# Patient Record
Sex: Female | Born: 1995 | Race: White | Hispanic: No | Marital: Single | Smoking: Never smoker
Health system: Southern US, Community
[De-identification: ages and names within clinical notes are randomized; demographics above are authoritative.]

## PROBLEM LIST (undated history)

## (undated) DIAGNOSIS — N189 Chronic kidney disease, unspecified: Secondary | ICD-10-CM

## (undated) DIAGNOSIS — F909 Attention-deficit hyperactivity disorder, unspecified type: Secondary | ICD-10-CM

## (undated) DIAGNOSIS — R519 Headache, unspecified: Secondary | ICD-10-CM

## (undated) DIAGNOSIS — F419 Anxiety disorder, unspecified: Secondary | ICD-10-CM

## (undated) DIAGNOSIS — F32A Depression, unspecified: Secondary | ICD-10-CM

## (undated) HISTORY — PX: NO PAST SURGERIES: SHX2092

---
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
## 2015-04-08 DIAGNOSIS — F909 Attention-deficit hyperactivity disorder, unspecified type: Secondary | ICD-10-CM | POA: Insufficient documentation

## 2016-10-19 DIAGNOSIS — K5900 Constipation, unspecified: Secondary | ICD-10-CM | POA: Insufficient documentation

## 2016-10-19 DIAGNOSIS — F411 Generalized anxiety disorder: Secondary | ICD-10-CM | POA: Insufficient documentation

## 2017-08-19 DIAGNOSIS — R002 Palpitations: Secondary | ICD-10-CM | POA: Insufficient documentation

## 2017-11-06 ENCOUNTER — Emergency Department (HOSPITAL_COMMUNITY)
Admission: EM | Admit: 2017-11-06 | Discharge: 2017-11-06 | Disposition: A | Discharge status: Home / Self Care | Payer: Managed Care, Other (non HMO) | Attending: Physician Assistant | Admitting: Physician Assistant

## 2017-11-06 ENCOUNTER — Emergency Department (HOSPITAL_COMMUNITY): Payer: Managed Care, Other (non HMO)

## 2017-11-06 ENCOUNTER — Encounter (HOSPITAL_COMMUNITY): Payer: Self-pay | Admitting: Emergency Medicine

## 2017-11-06 DIAGNOSIS — K5900 Constipation, unspecified: Secondary | ICD-10-CM | POA: Diagnosis not present

## 2017-11-06 DIAGNOSIS — R110 Nausea: Secondary | ICD-10-CM | POA: Diagnosis not present

## 2017-11-06 DIAGNOSIS — R1084 Generalized abdominal pain: Secondary | ICD-10-CM | POA: Diagnosis present

## 2017-11-06 LAB — CBC WITH DIFFERENTIAL/PLATELET
BASOS ABS: 0 10*3/uL (ref 0.0–0.1)
Basophils Relative: 0 %
EOS ABS: 0 10*3/uL (ref 0.0–0.7)
EOS PCT: 0 %
HCT: 37.2 % (ref 36.0–46.0)
Hemoglobin: 12.6 g/dL (ref 12.0–15.0)
Lymphocytes Relative: 9 %
Lymphs Abs: 0.7 10*3/uL (ref 0.7–4.0)
MCH: 32.1 pg (ref 26.0–34.0)
MCHC: 33.9 g/dL (ref 30.0–36.0)
MCV: 94.9 fL (ref 78.0–100.0)
Monocytes Absolute: 0.7 10*3/uL (ref 0.1–1.0)
Monocytes Relative: 8 %
Neutro Abs: 6.5 10*3/uL (ref 1.7–7.7)
Neutrophils Relative %: 83 %
PLATELETS: 173 10*3/uL (ref 150–400)
RBC: 3.92 MIL/uL (ref 3.87–5.11)
RDW: 12.8 % (ref 11.5–15.5)
WBC: 7.9 10*3/uL (ref 4.0–10.5)

## 2017-11-06 LAB — URINALYSIS, ROUTINE W REFLEX MICROSCOPIC
Bilirubin Urine: NEGATIVE
GLUCOSE, UA: NEGATIVE mg/dL
KETONES UR: 80 mg/dL — AB
Leukocytes, UA: NEGATIVE
Nitrite: NEGATIVE
PROTEIN: NEGATIVE mg/dL
Specific Gravity, Urine: 1.027 (ref 1.005–1.030)
pH: 5 (ref 5.0–8.0)

## 2017-11-06 LAB — COMPREHENSIVE METABOLIC PANEL
ALT: 10 U/L — ABNORMAL LOW (ref 14–54)
ANION GAP: 9 (ref 5–15)
AST: 14 U/L — AB (ref 15–41)
Albumin: 3.9 g/dL (ref 3.5–5.0)
Alkaline Phosphatase: 32 U/L — ABNORMAL LOW (ref 38–126)
BUN: 10 mg/dL (ref 6–20)
CHLORIDE: 104 mmol/L (ref 101–111)
CO2: 23 mmol/L (ref 22–32)
Calcium: 8.5 mg/dL — ABNORMAL LOW (ref 8.9–10.3)
Creatinine, Ser: 0.63 mg/dL (ref 0.44–1.00)
Glucose, Bld: 93 mg/dL (ref 65–99)
POTASSIUM: 3.4 mmol/L — AB (ref 3.5–5.1)
Sodium: 136 mmol/L (ref 135–145)
Total Bilirubin: 1.1 mg/dL (ref 0.3–1.2)
Total Protein: 7 g/dL (ref 6.5–8.1)

## 2017-11-06 LAB — LIPASE, BLOOD: LIPASE: 28 U/L (ref 11–51)

## 2017-11-06 LAB — PREGNANCY, URINE: PREG TEST UR: NEGATIVE

## 2017-11-06 LAB — I-STAT BETA HCG BLOOD, ED (MC, WL, AP ONLY): HCG, QUANTITATIVE: 10 m[IU]/mL — AB (ref <5)

## 2017-11-06 LAB — I-STAT CG4 LACTIC ACID, ED: LACTIC ACID, VENOUS: 0.68 mmol/L (ref 0.5–1.9)

## 2017-11-06 MED ORDER — SODIUM CHLORIDE 0.9 % IV BOLUS
1000.0000 mL | Freq: Once | INTRAVENOUS | Status: AC
  Administered 2017-11-06: 1000 mL via INTRAVENOUS

## 2017-11-06 MED ORDER — KETOROLAC TROMETHAMINE 30 MG/ML IJ SOLN
30.0000 mg | Freq: Once | INTRAMUSCULAR | Status: AC
  Administered 2017-11-06: 30 mg via INTRAVENOUS
  Filled 2017-11-06: qty 1

## 2017-11-06 MED ORDER — ONDANSETRON HCL 4 MG/2ML IJ SOLN
4.0000 mg | Freq: Once | INTRAMUSCULAR | Status: AC
  Administered 2017-11-06: 4 mg via INTRAVENOUS
  Filled 2017-11-06: qty 2

## 2017-11-06 MED ORDER — ACETAMINOPHEN 500 MG PO TABS
1000.0000 mg | ORAL_TABLET | Freq: Once | ORAL | Status: AC
  Administered 2017-11-06: 1000 mg via ORAL
  Filled 2017-11-06: qty 2

## 2017-11-06 MED ORDER — IBUPROFEN 600 MG PO TABS: 600.0000 mg | ORAL_TABLET | Freq: Four times a day (QID) | ORAL | 0 refills | Status: DC | PRN

## 2017-11-06 MED ORDER — ONDANSETRON HCL 4 MG PO TABS: 4.0000 mg | ORAL_TABLET | Freq: Four times a day (QID) | ORAL | 0 refills | Status: DC

## 2017-11-06 NOTE — ED Provider Notes (Signed)
[facility]-EMERGENCY DEPT Provider Note   CSN: [id number] Arrival date & time: 11/06/17  1317     History   Chief Complaint Chief Complaint  Patient presents with  . Flank Pain    HPI Gail Wilson is a 22 y.o. female who presents to the emergency department with a chief complaint of flank pain.    The patient endorses nausea and periumbilical abdominal pain that began yesterday.  She reports that today the pain is spread to her bilateral flanks.  She characterizes the pain as dull and throbbing.  It is worse with standing and improved with laying down.  She denies vomiting, fever, chills, diarrhea, dysuria, hematuria, vaginal pain or itching, melena, hematochezia, or back pain.  She is concerned that she may have a UTI or a kidney infection as she has had several in the past.  She is passing flatus.  No increased burping or belching.  Last bowel movement was 5 days ago.  She reports that she has had difficulty with constipation in the past.  She states that she uses Senokot laxatives frequently.  She states that she was only able to have a bowel movement 5 days ago after taking a laxative.  She has an Implanon for birth control.    Reports that she drinks 6-7 alcoholic beverages 2 nights ago, but reports that this is how much she will typically drink in one setting and is not a change from her usual use.  She denies a history of chronic over-the-counter pain medications.  No history of abdominal surgery.  She is sexually active with one female partner.  No history of STDs.  No history of nephrolithiasis.  The history is provided by the patient. No language interpreter was used.    Past Medical History:  Diagnosis Date  . ADHD (attention deficit hyperactivity disorder)     Patient Active Problem List   Diagnosis Date Noted  . Encounter for fitting of diaphragm 05/03/2014    History reviewed. No pertinent surgical history.   OB History   None       Home Medications    Prior to Admission medications   Medication Sig Start Date End Date Taking? Authorizing Provider  acetaminophen (TYLENOL) 500 MG tablet Take 500 mg by mouth every 6 (six) hours as needed for headache.   Yes [provider]  etonogestrel (NEXPLANON) 68 MG IMPL implant 1 each by Subdermal route once.   Yes [provider]  ibuprofen (ADVIL,MOTRIN) 600 MG tablet Take 1 tablet (600 mg total) by mouth every 6 (six) hours as needed. 11/06/17   Rashad Auld A, PA-C  nitrofurantoin, macrocrystal-monohydrate, (MACROBID) 100 MG capsule Take 100 mg by mouth as directed. Before sexual intercourse 10/27/17   [provider]  ondansetron (ZOFRAN) 4 MG tablet Take 1 tablet (4 mg total) by mouth every 6 (six) hours. 11/06/17   Yahel Fuston, Coral Else, PA-C    Family History History reviewed. No pertinent family history.  Social History Social History   Tobacco Use  . Smoking status: Never Smoker  . Smokeless tobacco: Never Used  Substance Use Topics  . Alcohol use: No    Alcohol/week: 0.0 oz  . Drug use: No     Allergies   Amoxil [amoxicillin] and Penicillins   Review of Systems Review of Systems  Constitutional: Negative for activity change, chills and fever.  HENT: Negative for congestion.   Eyes: Negative for visual disturbance.  Respiratory: Negative for shortness of breath.  Cardiovascular: Negative for chest pain.  Gastrointestinal: Positive for abdominal pain, constipation and nausea. Negative for abdominal distention, anal bleeding, blood in stool, diarrhea, rectal pain and vomiting.  Genitourinary: Positive for flank pain and vaginal bleeding. Negative for dysuria, frequency, pelvic pain, vaginal discharge and vaginal pain.  Musculoskeletal: Negative for arthralgias, back pain, myalgias and neck stiffness.  Skin: Negative for rash.  Allergic/Immunologic: Negative for immunocompromised state.  Neurological: Negative for dizziness,  syncope, weakness, numbness and headaches.  Psychiatric/Behavioral: Negative for confusion.   Physical Exam Updated Vital Signs BP 98/62 (BP Location: Left Arm)   Pulse 88   Temp 98.7 F (37.1 C) (Oral)   Resp 16   LMP 11/05/2017   SpO2 98%   Physical Exam  Constitutional: No distress.  HENT:  Head: Normocephalic.  Eyes: Conjunctivae are normal.  Neck: Neck supple.  Cardiovascular: Normal rate, regular rhythm, normal heart sounds and intact distal pulses. Exam reveals no gallop and no friction rub.  No murmur heard. Pulmonary/Chest: Effort normal and breath sounds normal. No stridor. No respiratory distress. She has no wheezes. She has no rales. She exhibits no tenderness.  Abdominal: Soft. Bowel sounds are normal. She exhibits no distension and no mass. There is tenderness. There is no rebound and no guarding. No hernia.  Neurological: She is alert.  Skin: Skin is warm. No rash noted.  Psychiatric: Her behavior is normal.  Nursing note and vitals reviewed.   ED Treatments / Results  Labs (all labs ordered are listed, but only abnormal results are displayed) Labs Reviewed  URINALYSIS, ROUTINE W REFLEX MICROSCOPIC - Abnormal; Notable for the following components:      Result Value   APPearance HAZY (*)    Hgb urine dipstick MODERATE (*)    Ketones, ur 80 (*)    Bacteria, UA RARE (*)    All other components within normal limits  COMPREHENSIVE METABOLIC PANEL - Abnormal; Notable for the following components:   Potassium 3.4 (*)    Calcium 8.5 (*)    AST 14 (*)    ALT 10 (*)    Alkaline Phosphatase 32 (*)    All other components within normal limits  I-STAT BETA HCG BLOOD, ED ([facility], [facility], [facility] ONLY) - Abnormal; Notable for the following components:   I-stat hCG, quantitative 10.0 (*)    All other components within normal limits  URINE CULTURE  LIPASE, BLOOD  CBC WITH DIFFERENTIAL/PLATELET  PREGNANCY, URINE  I-STAT CG4 LACTIC ACID, ED    EKG None  Radiology Dg  Abdomen Acute W/chest  Result Date: 11/06/2017 CLINICAL DATA:  Nausea yesterday. Onset of bilateral flank pain today. EXAM: DG ABDOMEN ACUTE W/ 1V CHEST COMPARISON:  PA and lateral chest 07/30/2014. FINDINGS: Single-view of the chest demonstrates clear lungs and normal heart size. No pneumothorax or pleural effusion. Two views of the abdomen show no free intraperitoneal air. The bowel gas pattern is normal. A 0.6 cm in diameter calcification is seen just inferior to the right SI joint. No other unexpected abdominal calcification is identified. IMPRESSION: 0.6 cm calcification just inferior to the right SI joint cannot be definitively characterized. It could be a phlebolith but could also be a ureteral stone. The exam is otherwise negative. Electronically Signed   By: Drusilla Kanner M.D.   On: 11/06/2017 16:43   Ct Renal Stone Study  Result Date: 11/06/2017 CLINICAL DATA:  Bilateral flank pain and nausea since yesterday. EXAM: CT ABDOMEN AND PELVIS WITHOUT CONTRAST TECHNIQUE: Multidetector CT imaging of the abdomen  and pelvis was performed following the standard protocol without IV contrast. COMPARISON:  Chest and two views abdomen today. FINDINGS: Lower chest: Lung bases are clear. No pleural or pericardial effusion. Hepatobiliary: No focal liver abnormality is seen. No gallstones, gallbladder wall thickening, or biliary dilatation. Pancreas: Unremarkable. No pancreatic ductal dilatation or surrounding inflammatory changes. Spleen: Normal in size without focal abnormality. Adrenals/Urinary Tract: Adrenal glands are unremarkable. Kidneys are normal, without renal calculi, focal lesion, or hydronephrosis. Bladder is unremarkable. Calcification in the right side of the pelvis seen on comparison plain films today is most consistent with a phlebolith or a calcified lymph node. Stomach/Bowel: Stomach is within normal limits. The appendix is not definitely visualized but no pericecal inflammatory change is seen. No  evidence of bowel wall thickening, distention, or inflammatory changes. Vascular/Lymphatic: No significant vascular findings are present. No enlarged abdominal or pelvic lymph nodes. Reproductive: Uterus and bilateral adnexa are unremarkable. Tampon is in place. Other: Small volume of free pelvic fluid is consistent with physiologic change. Musculoskeletal: No worrisome lesion. A few small Schmorl's nodes are identified. IMPRESSION: Negative for urinary tract stone. Calcification seen on prior plain films is most compatible with a phlebolith. The appendix is not visualized but no periappendiceal inflammatory process is identified. The exam is otherwise unremarkable. Electronically Signed   By: Drusilla Kanner M.D.   On: 11/06/2017 17:26    Procedures Procedures (including critical care time)  Medications Ordered in ED Medications  sodium chloride 0.9 % bolus 1,000 mL (0 mLs Intravenous Stopped 11/06/17 1759)  ondansetron (ZOFRAN) injection 4 mg (4 mg Intravenous Given 11/06/17 1513)  acetaminophen (TYLENOL) tablet 1,000 mg (1,000 mg Oral Given 11/06/17 1514)  ketorolac (TORADOL) 30 MG/ML injection 30 mg (30 mg Intravenous Given 11/06/17 1800)     Initial Impression / Assessment and Plan / ED Course  I have reviewed the triage vital signs and the nursing notes.  Pertinent labs & imaging results that were available during my care of the patient were reviewed by me and considered in my medical decision making (see chart for details).     22 year old female with no significant past medical history presenting with nausea, abdominal pain, and flank pain. Patient is nontoxic, nonseptic appearing, in no apparent distress.  Patient's pain and other symptoms adequately managed in emergency department.  Fluid bolus given.  Labs, imaging and vitals reviewed.  Patient does not meet the SIRS or Sepsis criteria.  On repeat exam patient does not have a surgical abdomen and there are no peritoneal signs.  No  indication of appendicitis, bowel obstruction, bowel perforation, cholecystitis, diverticulitis, PID or ectopic pregnancy.  Suspect constipation. patient discharged home with symptomatic treatment and given strict instructions for follow-up with GI given chronic constipation.  I have also discussed reasons to return immediately to the ER.  Patient expresses understanding and agrees with plan.  Final Clinical Impressions(s) / ED Diagnoses   Final diagnoses:  Constipation, unspecified constipation type  Nausea    ED Discharge Orders        Ordered    ibuprofen (ADVIL,MOTRIN) 600 MG tablet  Every 6 hours PRN     11/06/17 1801    ondansetron (ZOFRAN) 4 MG tablet  Every 6 hours     11/06/17 1801       Frederik Pear A, PA-C 11/06/17 1809    Mackuen, Cindee Salt, MD 11/09/17 [phone]

## 2017-11-06 NOTE — ED Notes (Signed)
Patient transported to X-ray 

## 2017-11-06 NOTE — ED Notes (Signed)
Pt holding down PO fluids well. Pt drank 8oz of water and has had no emesis.

## 2017-11-06 NOTE — ED Triage Notes (Signed)
Pt c/o bilateral flank pain. Pt states nausea yesterday and then started with flank pain this morning.

## 2017-11-06 NOTE — Discharge Instructions (Addendum)
Thank you for allowing me to care of you today in the emergency department.  Swallow 1 tablet of Zofran every 6 hours as needed for nausea and/or vomiting.  Take 1 tablet of ibuprofen with food every 6 hours as needed for pain.  You can also take 650 mg of Tylenol every 6 hours for pain or alternate between these 2 medications and take 1 dose of each every 3 hours. For constipation:  -Take 2 capfuls of Miralax by mouth mixed with 16 ounces of juice or gatorade once. -After the first day, take 1 capful of Miralax by mouth daily mixed with 16 ounces of juice or gatrorade -If you experience diarrhea, you may decreased the daily dose by 1/2 capful as needed.  You can also mix 4 ounces of prune juice and 4 ounces of grape juice and a glass and warming up in the microwave for 30 seconds to help with constipation.  If you develop new or worsening symptoms including vomiting despite taking Zofran, high fever despite taking Tylenol and ibuprofen, severe abdominal pain despite taking medicine for pain control, dark black or blood in your stool, please return to the emergency department for re-evaluation.

## 2017-11-08 LAB — URINE CULTURE: Culture: NO GROWTH

## 2018-08-10 DIAGNOSIS — N390 Urinary tract infection, site not specified: Secondary | ICD-10-CM | POA: Insufficient documentation

## 2018-10-08 IMAGING — CR DG ABDOMEN ACUTE W/ 1V CHEST
3 series · 3 of 3 positions shown · non-contrast
Comparison: PA and lateral chest 07/30/2014.

CLINICAL DATA: Nausea yesterday. Onset of bilateral flank pain
today.

EXAM:
DG ABDOMEN ACUTE W/ 1V CHEST

[w chest pa]
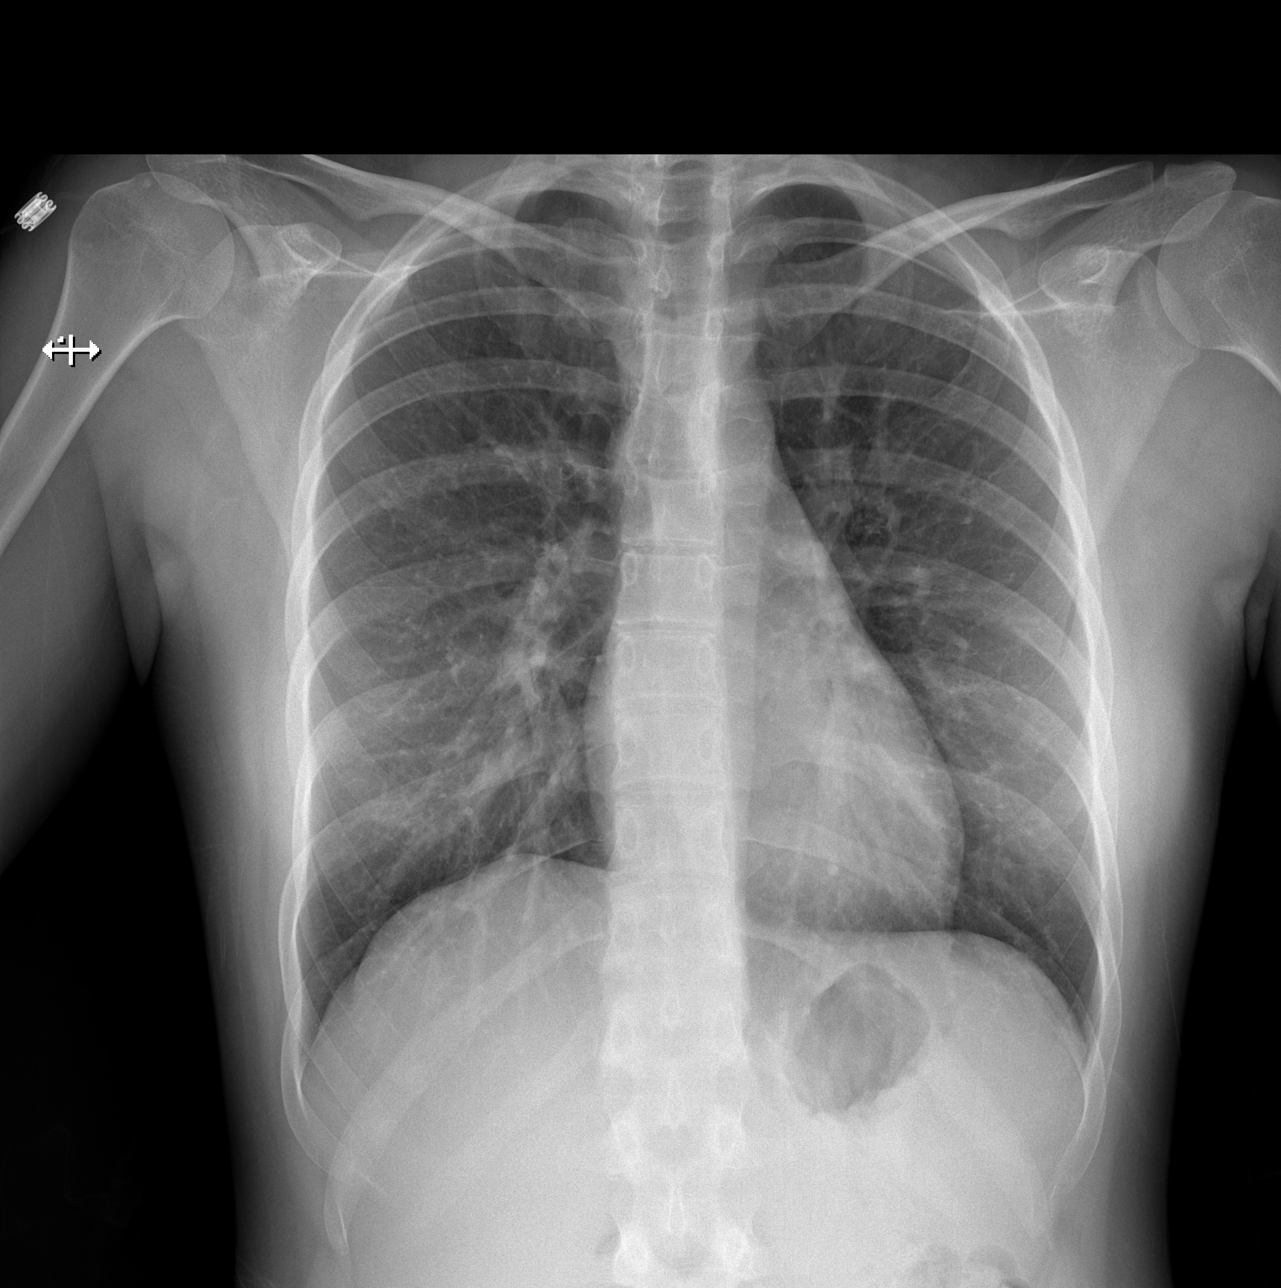

[w abdomen upright]
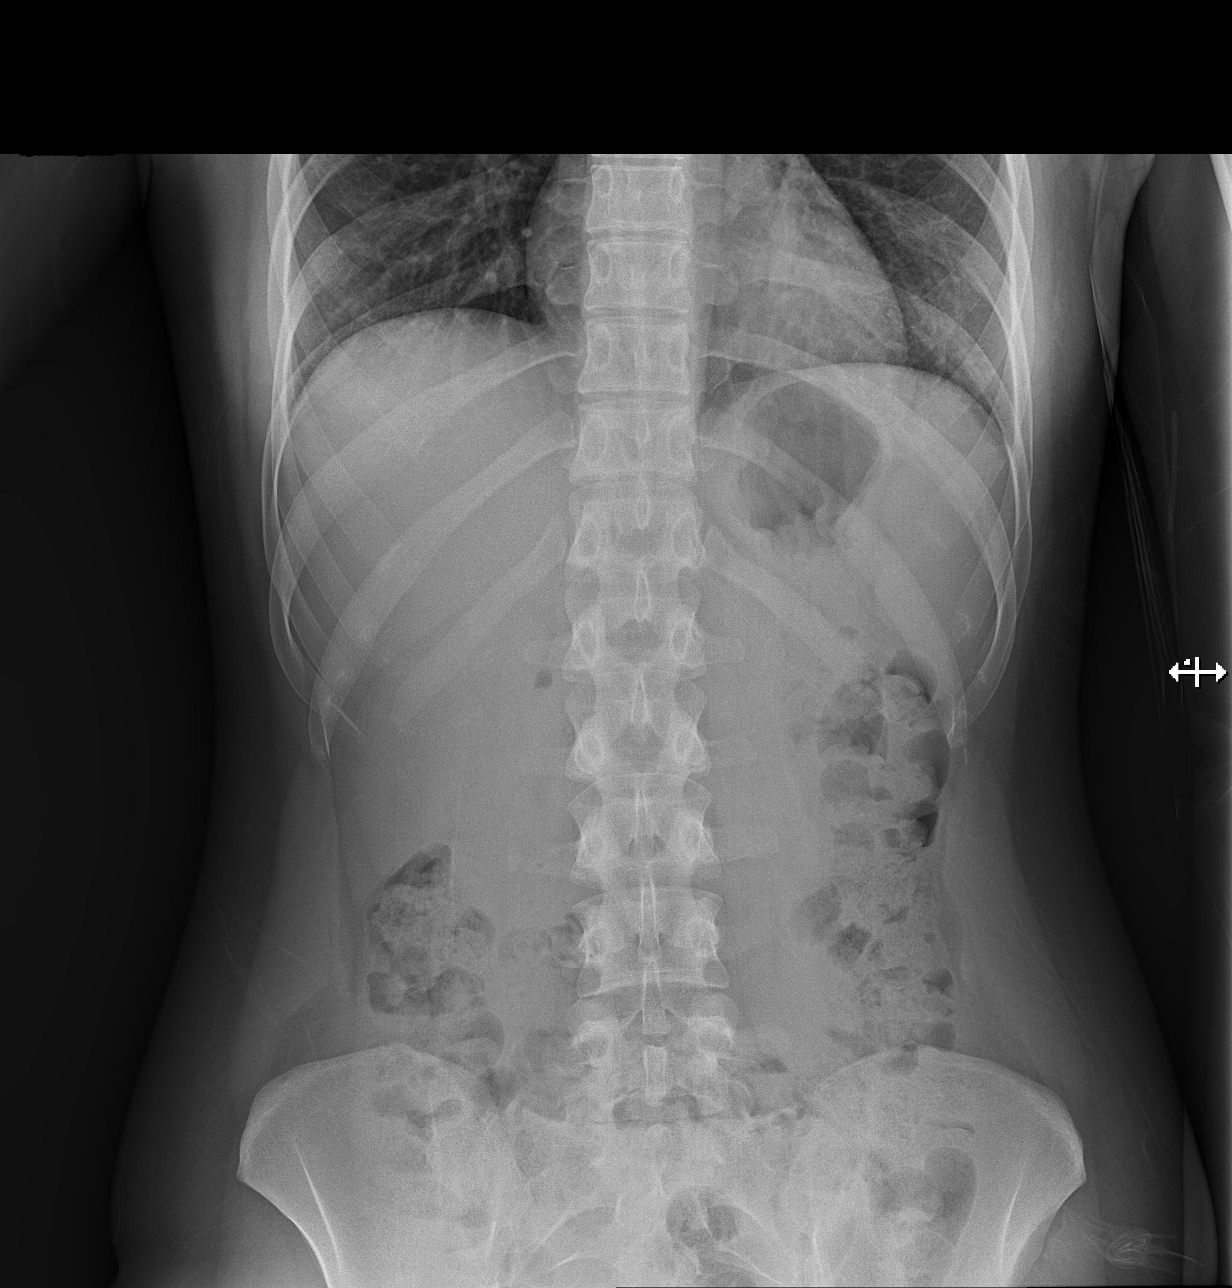

[t abdomen supine]
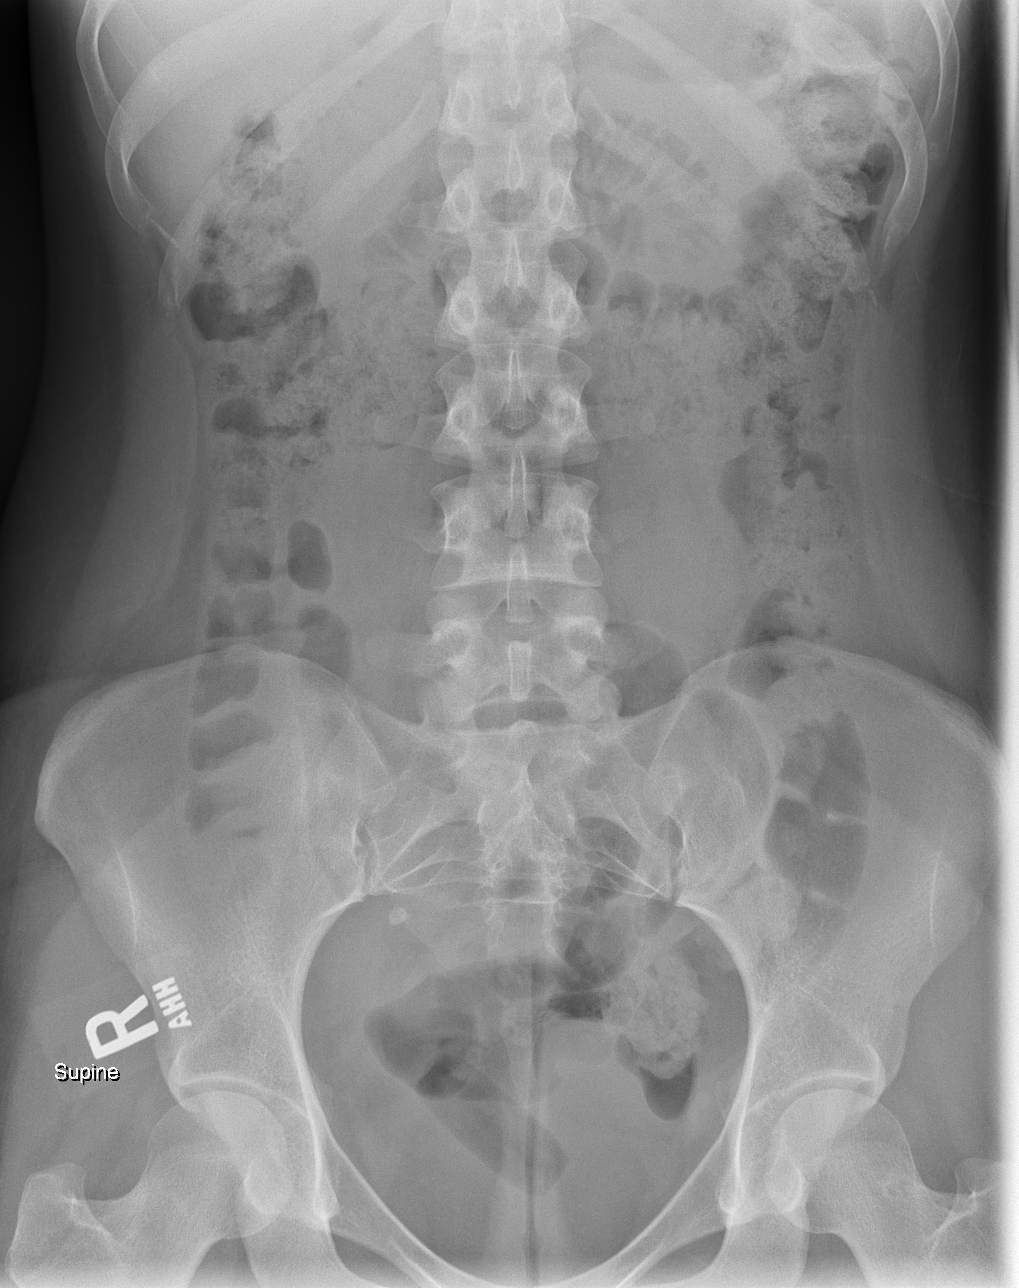

[3 of 3 positions shown; findings below may reference images not displayed]

FINDINGS: Single-view of the chest demonstrates clear lungs and normal heart
size. No pneumothorax or pleural effusion.

Two views of the abdomen show no free intraperitoneal air. The bowel
gas pattern is normal. A 0.6 cm in diameter calcification is seen
just inferior to the right SI joint. No other unexpected abdominal
calcification is identified.
IMPRESSION: 0.6 cm calcification just inferior to the right SI joint cannot be
definitively characterized. It could be a phlebolith but could also
be a ureteral stone. The exam is otherwise negative.

## 2018-10-08 IMAGING — CT CT RENAL STONE PROTOCOL
2 of 4 series · 16 of 46 positions shown, 18 images · non-contrast
Comparison: Chest and two views abdomen today.

CLINICAL DATA: Bilateral flank pain and nausea since yesterday.

EXAM:
CT ABDOMEN AND PELVIS WITHOUT CONTRAST
TECHNIQUE: Multidetector CT imaging of the abdomen and pelvis was performed
following the standard protocol without IV contrast.

[Series 2: axial st · axial · 0.77mm/px · z∈[-511,-111]mm · 13 of 92 slices shown, 15 images]
[im 6/92  soft-tissue]
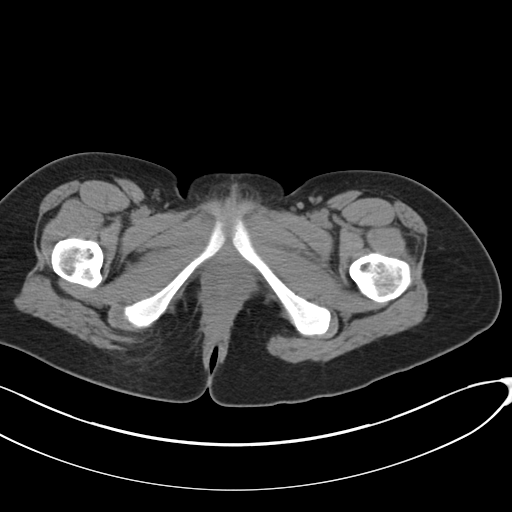
[im 6/92  bone]
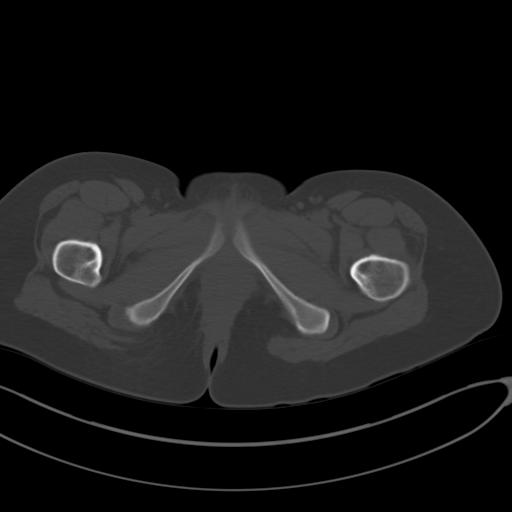
[im 11/92  soft-tissue]
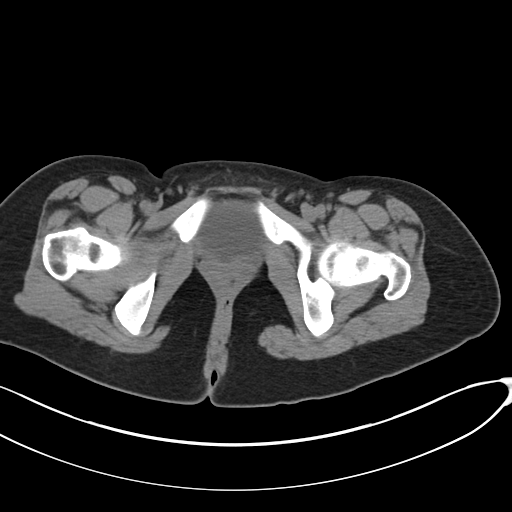
[im 21/92  soft-tissue]
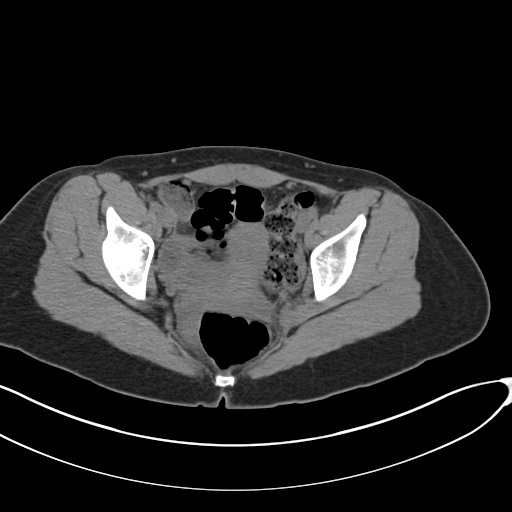
[im 26/92  soft-tissue]
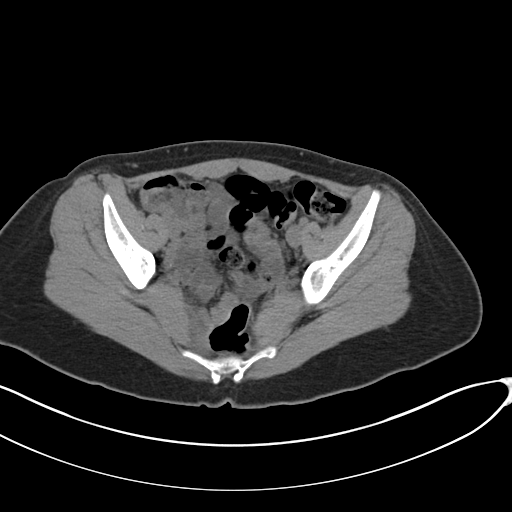
[im 31/92  soft-tissue]
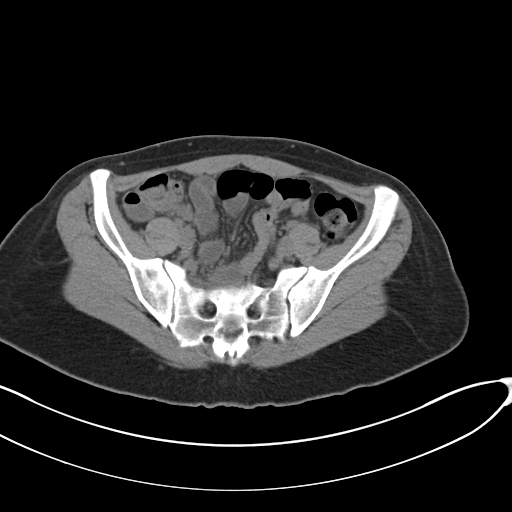
[im 41/92  soft-tissue]
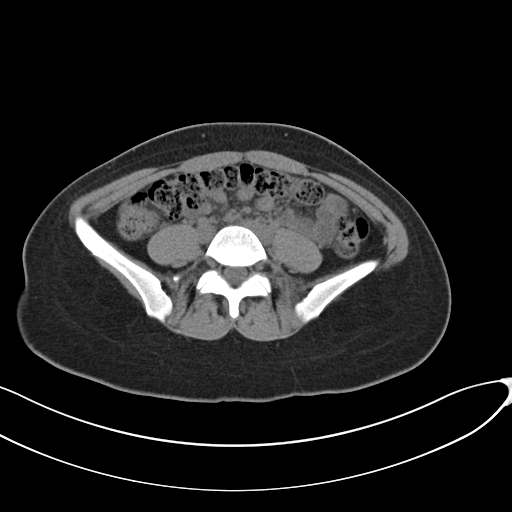
[im 46/92  soft-tissue]
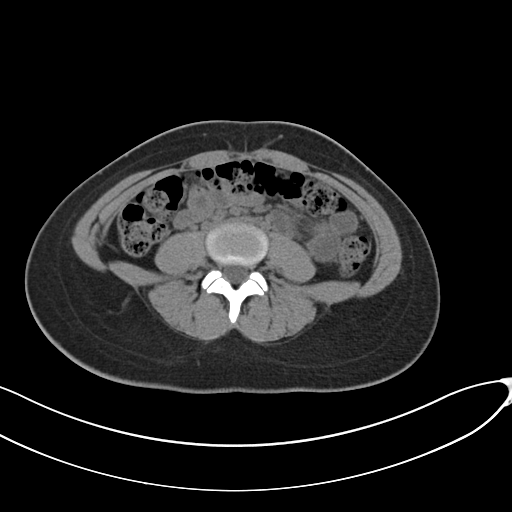
[im 51/92  soft-tissue]
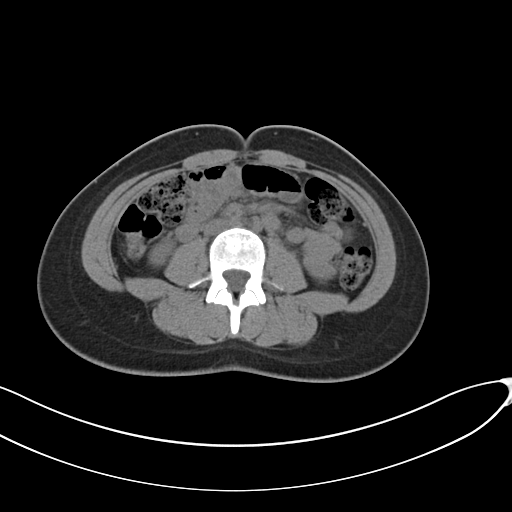
[im 61/92  soft-tissue]
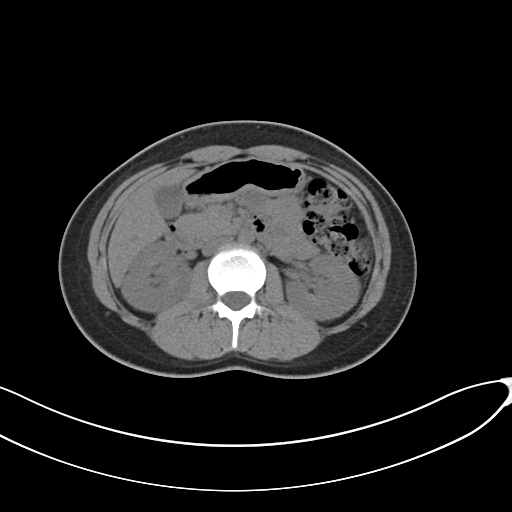
[im 61/92  bone]
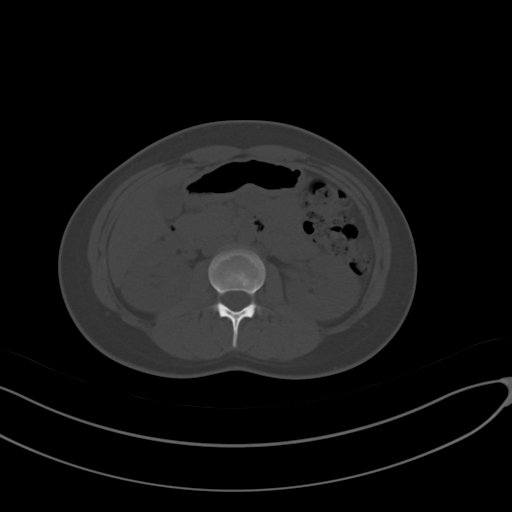
[im 66/92  soft-tissue]
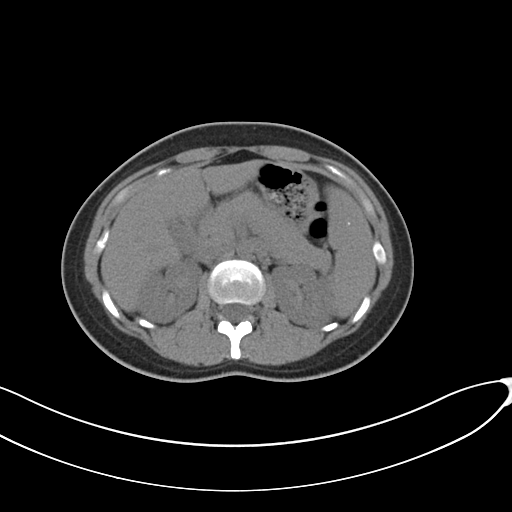
[im 71/92  soft-tissue]
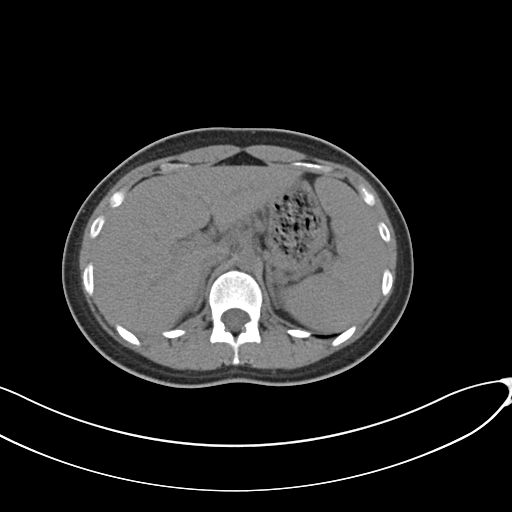
[im 81/92  soft-tissue]
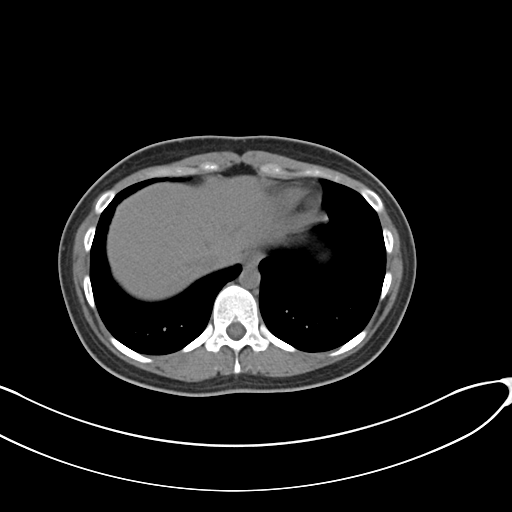
[im 86/92  soft-tissue]
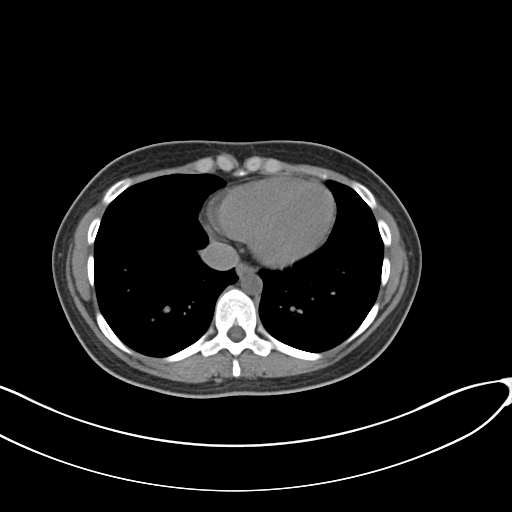

[Series 4: coronal · coronal · 0.68mm/px · 3 of 151 slices shown]
[im 51/151  soft-tissue]
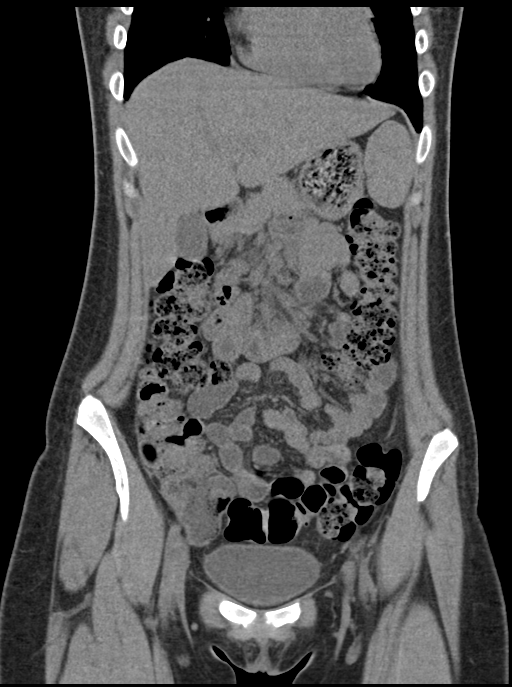
[im 67/151  soft-tissue]
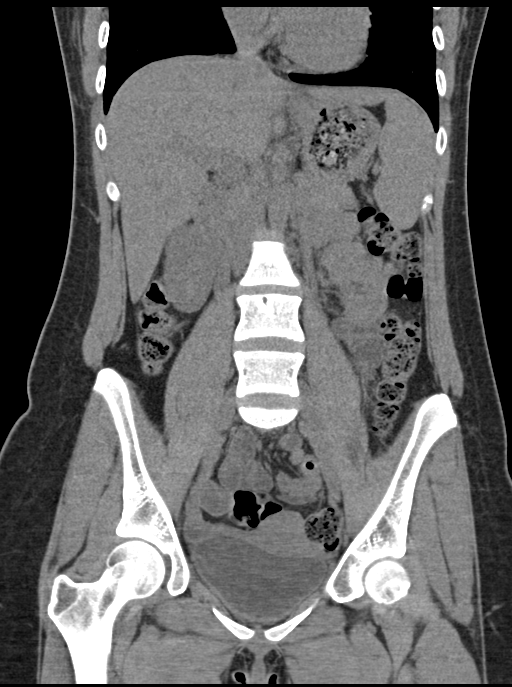
[im 84/151  soft-tissue]
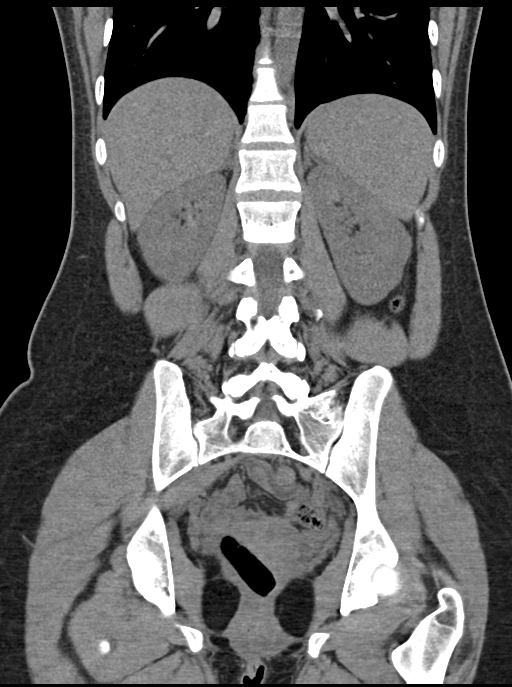

[16 of 46 positions shown; findings below may reference images not displayed]

FINDINGS: Lower chest: Lung bases are clear. No pleural or pericardial
effusion.

Hepatobiliary: No focal liver abnormality is seen. No gallstones,
gallbladder wall thickening, or biliary dilatation.

Pancreas: Unremarkable. No pancreatic ductal dilatation or
surrounding inflammatory changes.

Spleen: Normal in size without focal abnormality.

Adrenals/Urinary Tract: Adrenal glands are unremarkable. Kidneys are
normal, without renal calculi, focal lesion, or hydronephrosis.
Bladder is unremarkable. Calcification in the right side of the
pelvis seen on comparison plain films today is most consistent with
a phlebolith or a calcified lymph node.

Stomach/Bowel: Stomach is within normal limits. The appendix is not
definitely visualized but no pericecal inflammatory change is seen.
No evidence of bowel wall thickening, distention, or inflammatory
changes.

Vascular/Lymphatic: No significant vascular findings are present. No
enlarged abdominal or pelvic lymph nodes.

Reproductive: Uterus and bilateral adnexa are unremarkable. Tampon
is in place.

Other: Small volume of free pelvic fluid is consistent with
physiologic change.

Musculoskeletal: No worrisome lesion. A few small Schmorl's nodes
are identified.
IMPRESSION: Negative for urinary tract stone. Calcification seen on prior plain
films is most compatible with a phlebolith.

The appendix is not visualized but no periappendiceal inflammatory
process is identified. The exam is otherwise unremarkable.

## 2019-09-11 DIAGNOSIS — R519 Headache, unspecified: Secondary | ICD-10-CM | POA: Insufficient documentation

## 2020-06-28 NOTE — L&D Delivery Note (Signed)
Delivery Note At 8:17 PM a viable female was delivered via Vaginal, Spontaneous (Presentation: Left Occiput Anterior).  APGAR: 8, 9; weight  pending.  Placenta status: Spontaneous, Intact.  Cord: 3 vessels with the following complications: None.  Cord pH: n/a  Anesthesia: Epidural Episiotomy: None Lacerations: Vaginal;Left Labial Suture Repair: 3.0 vicryl rapide Est. Blood Loss (mL):  250  Mom to postpartum.  Baby to Couplet care / Skin to Skin.  Mitchel Honour 06/16/2021, 8:37 PM

## 2020-11-19 LAB — OB RESULTS CONSOLE HEPATITIS B SURFACE ANTIGEN: Hepatitis B Surface Ag: NEGATIVE

## 2020-11-19 LAB — OB RESULTS CONSOLE HIV ANTIBODY (ROUTINE TESTING): HIV: NONREACTIVE

## 2020-11-19 LAB — OB RESULTS CONSOLE RPR: RPR: NONREACTIVE

## 2020-11-19 LAB — OB RESULTS CONSOLE RUBELLA ANTIBODY, IGM: Rubella: IMMUNE

## 2020-11-19 LAB — HEPATITIS C ANTIBODY: HCV Ab: NEGATIVE

## 2020-11-26 LAB — OB RESULTS CONSOLE GC/CHLAMYDIA
Chlamydia: NEGATIVE
Gonorrhea: NEGATIVE

## 2021-05-26 LAB — OB RESULTS CONSOLE GBS: GBS: NEGATIVE

## 2021-06-10 ENCOUNTER — Encounter (HOSPITAL_COMMUNITY): Payer: Self-pay | Admitting: Obstetrics and Gynecology

## 2021-06-10 ENCOUNTER — Other Ambulatory Visit: Payer: Self-pay

## 2021-06-10 ENCOUNTER — Inpatient Hospital Stay (HOSPITAL_COMMUNITY)
Admission: AD | Admit: 2021-06-10 | Discharge: 2021-06-10 | Disposition: A | Discharge status: Home / Self Care | Payer: Managed Care, Other (non HMO) | Attending: Obstetrics and Gynecology | Admitting: Obstetrics and Gynecology

## 2021-06-10 DIAGNOSIS — O471 False labor at or after 37 completed weeks of gestation: Secondary | ICD-10-CM | POA: Insufficient documentation

## 2021-06-10 DIAGNOSIS — O479 False labor, unspecified: Secondary | ICD-10-CM | POA: Diagnosis not present

## 2021-06-10 DIAGNOSIS — N189 Chronic kidney disease, unspecified: Secondary | ICD-10-CM | POA: Insufficient documentation

## 2021-06-10 DIAGNOSIS — O26833 Pregnancy related renal disease, third trimester: Secondary | ICD-10-CM | POA: Diagnosis present

## 2021-06-10 DIAGNOSIS — Z3A38 38 weeks gestation of pregnancy: Secondary | ICD-10-CM | POA: Diagnosis not present

## 2021-06-10 HISTORY — DX: Depression, unspecified: F32.A

## 2021-06-10 HISTORY — DX: Anxiety disorder, unspecified: F41.9

## 2021-06-10 HISTORY — DX: Chronic kidney disease, unspecified: N18.9

## 2021-06-10 HISTORY — DX: Headache, unspecified: R51.9

## 2021-06-10 LAB — URINALYSIS, ROUTINE W REFLEX MICROSCOPIC
Bilirubin Urine: NEGATIVE
Glucose, UA: NEGATIVE mg/dL
Ketones, ur: NEGATIVE mg/dL
Nitrite: NEGATIVE
Protein, ur: NEGATIVE mg/dL
Specific Gravity, Urine: 1.02 (ref 1.005–1.030)
pH: 6 (ref 5.0–8.0)

## 2021-06-10 LAB — URINALYSIS, MICROSCOPIC (REFLEX)

## 2021-06-10 MED ORDER — OXYCODONE-ACETAMINOPHEN 5-325 MG PO TABS
2.0000 | ORAL_TABLET | Freq: Once | ORAL | Status: AC
  Administered 2021-06-10: 04:00:00 2 via ORAL
  Filled 2021-06-10: qty 2

## 2021-06-10 NOTE — MAU Note (Signed)
Pts pain score 6 between contractions in lower abdomen with contraction pain score increases to 8 in same area.

## 2021-06-10 NOTE — MAU Note (Signed)
UA collected and sent to Lab. Birthing ball provided. Category 1 tracing. Ok'd with CNM to allow pt to remain off monitor. POC discussed with pt and SO. Using paced breathing with stronger contractions. Calm and well controlled. Water provided for po hydration.

## 2021-06-10 NOTE — MAU Note (Signed)
PT SAYS  LOWER ABD PAIN  STARTED AT 8 PM PNC- DR GLASSER TODAY - VE 1 CM DENIES HSV GBS- NEG

## 2021-06-10 NOTE — MAU Note (Signed)
SVE without change. Pt states she feels contractions stronger over the last hour and a half. Pt feels that she would not be able to rest at home without pain medication

## 2021-06-10 NOTE — MAU Provider Note (Signed)
Chief Complaint:  Abdominal Pain   Event Date/Time   First Provider Initiated Contact with Patient 06/10/21 0206     HPI: Gail Wilson is a 25 y.o. G1P0 at 8w5dwho presents to maternity admissions reporting constant lower abdominal pain.  Not sure if it is due to contractions.  States "I have a really low pain tolerance".  She reports good fetal movement, denies LOF, vaginal bleeding, vaginal itching/burning, urinary symptoms, h/a, dizziness, n/v, diarrhea, constipation or fever/chills.    Abdominal Pain This is a new problem. The current episode started today. The problem occurs constantly. The problem has been unchanged. The quality of the pain is cramping. The abdominal pain does not radiate. Pertinent negatives include no fever, frequency, myalgias or nausea. Nothing aggravates the pain. The pain is relieved by Nothing. She has tried nothing for the symptoms.   RN note: PT SAYS  LOWER ABD PAIN  STARTED AT 8 PM PNC- DR GLASSER TODAY - VE 1 CM DENIES HSV GBS- NEG  Past Medical History: Past Medical History:  Diagnosis Date   ADHD (attention deficit hyperactivity disorder)    Anxiety    Chronic kidney disease    frequent UTI's   Depression    Headache     Past obstetric history: OB History  Gravida Para Term Preterm AB Living  1            SAB IAB Ectopic Multiple Live Births               # Outcome Date GA Lbr Len/2nd Weight Sex Delivery Anes PTL Lv  1 Current             Past Surgical History: Past Surgical History:  Procedure Laterality Date   NO PAST SURGERIES      Family History: Family History  Problem Relation Age of Onset   Cancer Maternal Grandmother     Social History: Social History   Tobacco Use   Smoking status: Never   Smokeless tobacco: Never  Vaping Use   Vaping Use: Never used  Substance Use Topics   Alcohol use: No    Alcohol/week: 0.0 standard drinks   Drug use: No    Allergies:  Allergies  Allergen Reactions   Amoxil  [Amoxicillin]    Penicillins     Meds:  Medications Prior to Admission  Medication Sig Dispense Refill Last Dose   acetaminophen (TYLENOL) 500 MG tablet Take 500 mg by mouth every 6 (six) hours as needed for headache.   06/09/2021   nitrofurantoin, macrocrystal-monohydrate, (MACROBID) 100 MG capsule Take 100 mg by mouth as directed. Before sexual intercourse  2 Past Month   Prenatal Vit-Fe Fumarate-FA (PRENATAL PO) Take 1 tablet by mouth daily.   06/09/2021 at 1830   etonogestrel (NEXPLANON) 68 MG IMPL implant 1 each by Subdermal route once.      ibuprofen (ADVIL,MOTRIN) 600 MG tablet Take 1 tablet (600 mg total) by mouth every 6 (six) hours as needed. 30 tablet 0    ondansetron (ZOFRAN) 4 MG tablet Take 1 tablet (4 mg total) by mouth every 6 (six) hours. 12 tablet 0     I have reviewed patient's Past Medical Hx, Surgical Hx, Family Hx, Social Hx, medications and allergies.   ROS:  Review of Systems  Constitutional:  Negative for fever.  Gastrointestinal:  Positive for abdominal pain. Negative for nausea.  Genitourinary:  Negative for frequency.  Musculoskeletal:  Negative for myalgias.  Other systems negative  Physical Exam  Patient  Vitals for the past 24 hrs:  BP Temp Temp src Pulse Resp Height Weight  06/10/21 0117 128/79 98.1 F (36.7 C) Oral 85 18 5\' 3"  (1.6 m) 84.7 kg   Constitutional: Well-developed, well-nourished female in no acute distress.  Cardiovascular: normal rate  Respiratory: normal effort GI: Abd soft, non-tender, gravid appropriate for gestational age.   No rebound or guarding. MS: Extremities nontender, no edema, normal ROM Neurologic: Alert and oriented x 4.  GU: Neg CVAT.  PELVIC EXAM:  Dilation: 1.5 Effacement (%): Thick Cervical Position: Posterior Station: -3 Presentation: Vertex Exam by:: [email or web address] RN   FHT:  Baseline 145 , moderate variability, accelerations present, no decelerations Contractions:  Irregular     Labs: Results for orders  placed or performed during the hospital encounter of 06/10/21 (from the past 24 hour(s))  Urinalysis, Routine w reflex microscopic     Status: Abnormal   Collection Time: 06/10/21  2:30 AM  Result Value Ref Range   Color, Urine YELLOW YELLOW   APPearance CLEAR CLEAR   Specific Gravity, Urine 1.020 1.005 - 1.030   pH 6.0 5.0 - 8.0   Glucose, UA NEGATIVE NEGATIVE mg/dL   Hgb urine dipstick TRACE (A) NEGATIVE   Bilirubin Urine NEGATIVE NEGATIVE   Ketones, ur NEGATIVE NEGATIVE mg/dL   Protein, ur NEGATIVE NEGATIVE mg/dL   Nitrite NEGATIVE NEGATIVE   Leukocytes,Ua TRACE (A) NEGATIVE  Urinalysis, Microscopic (reflex)     Status: Abnormal   Collection Time: 06/10/21  2:30 AM  Result Value Ref Range   RBC / HPF 0-5 0 - 5 RBC/hpf   WBC, UA 0-5 0 - 5 WBC/hpf   Bacteria, UA RARE (A) NONE SEEN   Squamous Epithelial / LPF 0-5 0 - 5   Mucus PRESENT        Imaging:  No results found.  MAU Course/MDM: I have ordered labs and reviewed results. UA is WNL NST reviewed, reactive  Treatments in MAU included EFM, labor observation and Percocet given for pain prior to discharge.    Assessment: Single IUP at [redacted]w[redacted]d Prodromal vs Latent labor Reactive fetal heart rate pattern  Plan: Discharge home Labor precautions and fetal kick counts Encouraged to come back for labor or leaking, or decreased FM Follow up in Office for prenatal visits  Encouraged to return if she develops worsening of symptoms, increase in pain, fever, or other concerning symptoms.  Pt stable at time of discharge.  [redacted]w[redacted]d CNM, MSN Certified Nurse-Midwife 06/10/2021 2:06 AM

## 2021-06-16 ENCOUNTER — Inpatient Hospital Stay (HOSPITAL_COMMUNITY): Payer: Managed Care, Other (non HMO)

## 2021-06-16 ENCOUNTER — Encounter (HOSPITAL_COMMUNITY): Payer: Self-pay | Admitting: Obstetrics & Gynecology

## 2021-06-16 ENCOUNTER — Inpatient Hospital Stay (HOSPITAL_COMMUNITY)
Admission: AD | Admit: 2021-06-16 | Discharge: 2021-06-18 | DRG: 807 | Disposition: A | Discharge status: Home / Self Care | Payer: Managed Care, Other (non HMO) | Attending: Obstetrics & Gynecology | Admitting: Obstetrics & Gynecology

## 2021-06-16 ENCOUNTER — Other Ambulatory Visit: Payer: Self-pay

## 2021-06-16 ENCOUNTER — Other Ambulatory Visit (HOSPITAL_COMMUNITY): Payer: Self-pay

## 2021-06-16 ENCOUNTER — Inpatient Hospital Stay (HOSPITAL_COMMUNITY): Payer: Managed Care, Other (non HMO) | Admitting: Anesthesiology

## 2021-06-16 DIAGNOSIS — Z20822 Contact with and (suspected) exposure to covid-19: Secondary | ICD-10-CM | POA: Diagnosis present

## 2021-06-16 DIAGNOSIS — Z3490 Encounter for supervision of normal pregnancy, unspecified, unspecified trimester: Secondary | ICD-10-CM

## 2021-06-16 DIAGNOSIS — Z3A39 39 weeks gestation of pregnancy: Secondary | ICD-10-CM | POA: Diagnosis not present

## 2021-06-16 DIAGNOSIS — O26893 Other specified pregnancy related conditions, third trimester: Secondary | ICD-10-CM | POA: Diagnosis present

## 2021-06-16 DIAGNOSIS — O134 Gestational [pregnancy-induced] hypertension without significant proteinuria, complicating childbirth: Principal | ICD-10-CM | POA: Diagnosis present

## 2021-06-16 LAB — CBC
HCT: 31.2 % — ABNORMAL LOW (ref 36.0–46.0)
Hemoglobin: 10.4 g/dL — ABNORMAL LOW (ref 12.0–15.0)
MCH: 30.3 pg (ref 26.0–34.0)
MCHC: 33.3 g/dL (ref 30.0–36.0)
MCV: 91 fL (ref 80.0–100.0)
Platelets: 184 10*3/uL (ref 150–400)
RBC: 3.43 MIL/uL — ABNORMAL LOW (ref 3.87–5.11)
RDW: 14.2 % (ref 11.5–15.5)
WBC: 10.6 10*3/uL — ABNORMAL HIGH (ref 4.0–10.5)
nRBC: 0 % (ref 0.0–0.2)

## 2021-06-16 LAB — TYPE AND SCREEN
ABO/RH(D): A POS
Antibody Screen: NEGATIVE

## 2021-06-16 LAB — RPR: RPR Ser Ql: NONREACTIVE

## 2021-06-16 LAB — RESP PANEL BY RT-PCR (FLU A&B, COVID) ARPGX2
Influenza A by PCR: NEGATIVE
Influenza B by PCR: NEGATIVE
SARS Coronavirus 2 by RT PCR: NEGATIVE

## 2021-06-16 MED ORDER — BENZOCAINE-MENTHOL 20-0.5 % EX AERO
1.0000 "application " | INHALATION_SPRAY | CUTANEOUS | Status: DC | PRN
  Administered 2021-06-18: 1 via TOPICAL
  Filled 2021-06-16 (×2): qty 56

## 2021-06-16 MED ORDER — ZOLPIDEM TARTRATE 5 MG PO TABS: 5.0000 mg | ORAL_TABLET | Freq: Every evening | ORAL | Status: DC | PRN

## 2021-06-16 MED ORDER — IBUPROFEN 600 MG PO TABS
600.0000 mg | ORAL_TABLET | Freq: Four times a day (QID) | ORAL | Status: DC
  Administered 2021-06-17 – 2021-06-18 (×6): 600 mg via ORAL
  Filled 2021-06-16 (×6): qty 1

## 2021-06-16 MED ORDER — WITCH HAZEL-GLYCERIN EX PADS
1.0000 "application " | MEDICATED_PAD | CUTANEOUS | Status: DC | PRN
  Administered 2021-06-18: 1 via TOPICAL

## 2021-06-16 MED ORDER — PHENYLEPHRINE 40 MCG/ML (10ML) SYRINGE FOR IV PUSH (FOR BLOOD PRESSURE SUPPORT): 80.0000 ug | PREFILLED_SYRINGE | INTRAVENOUS | Status: DC | PRN

## 2021-06-16 MED ORDER — DIPHENHYDRAMINE HCL 50 MG/ML IJ SOLN: 12.5000 mg | INTRAMUSCULAR | Status: DC | PRN

## 2021-06-16 MED ORDER — OXYTOCIN-SODIUM CHLORIDE 30-0.9 UT/500ML-% IV SOLN
1.0000 m[IU]/min | INTRAVENOUS | Status: DC
  Administered 2021-06-16: 10:00:00 2 m[IU]/min via INTRAVENOUS

## 2021-06-16 MED ORDER — LACTATED RINGERS IV SOLN: INTRAVENOUS | Status: DC

## 2021-06-16 MED ORDER — LACTATED RINGERS IV SOLN: 500.0000 mL | INTRAVENOUS | Status: DC | PRN

## 2021-06-16 MED ORDER — EPHEDRINE 5 MG/ML INJ: 10.0000 mg | INTRAVENOUS | Status: DC | PRN

## 2021-06-16 MED ORDER — OXYCODONE-ACETAMINOPHEN 5-325 MG PO TABS
1.0000 | ORAL_TABLET | ORAL | Status: DC | PRN
  Administered 2021-06-17 (×2): 1 via ORAL
  Filled 2021-06-16: qty 1

## 2021-06-16 MED ORDER — OXYCODONE-ACETAMINOPHEN 5-325 MG PO TABS
2.0000 | ORAL_TABLET | ORAL | Status: DC | PRN
  Administered 2021-06-18 (×2): 2 via ORAL
  Filled 2021-06-16 (×3): qty 2

## 2021-06-16 MED ORDER — ONDANSETRON HCL 4 MG PO TABS: 4.0000 mg | ORAL_TABLET | ORAL | Status: DC | PRN

## 2021-06-16 MED ORDER — MISOPROSTOL 25 MCG QUARTER TABLET
25.0000 ug | ORAL_TABLET | ORAL | Status: DC | PRN
  Administered 2021-06-16: 04:00:00 25 ug via VAGINAL
  Filled 2021-06-16: qty 1

## 2021-06-16 MED ORDER — TETANUS-DIPHTH-ACELL PERTUSSIS 5-2.5-18.5 LF-MCG/0.5 IM SUSY: 0.5000 mL | PREFILLED_SYRINGE | Freq: Once | INTRAMUSCULAR | Status: DC

## 2021-06-16 MED ORDER — FENTANYL-BUPIVACAINE-NACL 0.5-0.125-0.9 MG/250ML-% EP SOLN
12.0000 mL/h | EPIDURAL | Status: DC | PRN
  Administered 2021-06-16: 12:00:00 12 mL/h via EPIDURAL
  Filled 2021-06-16: qty 250

## 2021-06-16 MED ORDER — OXYCODONE-ACETAMINOPHEN 5-325 MG PO TABS: 1.0000 | ORAL_TABLET | ORAL | Status: DC | PRN

## 2021-06-16 MED ORDER — COCONUT OIL OIL: 1.0000 "application " | TOPICAL_OIL | Status: DC | PRN

## 2021-06-16 MED ORDER — LACTATED RINGERS IV SOLN
500.0000 mL | Freq: Once | INTRAVENOUS | Status: AC
  Administered 2021-06-16: 10:00:00 500 mL via INTRAVENOUS

## 2021-06-16 MED ORDER — OXYTOCIN BOLUS FROM INFUSION
333.0000 mL | Freq: Once | INTRAVENOUS | Status: AC
  Administered 2021-06-16: 20:00:00 333 mL via INTRAVENOUS

## 2021-06-16 MED ORDER — SOD CITRATE-CITRIC ACID 500-334 MG/5ML PO SOLN
30.0000 mL | ORAL | Status: DC | PRN
  Administered 2021-06-16: 05:00:00 30 mL via ORAL
  Filled 2021-06-16: qty 30

## 2021-06-16 MED ORDER — TERBUTALINE SULFATE 1 MG/ML IJ SOLN: 0.2500 mg | Freq: Once | INTRAMUSCULAR | Status: DC | PRN

## 2021-06-16 MED ORDER — LIDOCAINE HCL (PF) 1 % IJ SOLN
INTRAMUSCULAR | Status: DC | PRN
  Administered 2021-06-16 (×2): 5 mL via EPIDURAL

## 2021-06-16 MED ORDER — SENNOSIDES-DOCUSATE SODIUM 8.6-50 MG PO TABS
2.0000 | ORAL_TABLET | Freq: Every day | ORAL | Status: DC
  Administered 2021-06-17 – 2021-06-18 (×2): 2 via ORAL
  Filled 2021-06-16 (×2): qty 2

## 2021-06-16 MED ORDER — ACETAMINOPHEN 325 MG PO TABS: 650.0000 mg | ORAL_TABLET | ORAL | Status: DC | PRN

## 2021-06-16 MED ORDER — IBUPROFEN 800 MG PO TABS
800.0000 mg | ORAL_TABLET | Freq: Once | ORAL | Status: AC
  Administered 2021-06-16: 21:00:00 800 mg via ORAL
  Filled 2021-06-16: qty 1

## 2021-06-16 MED ORDER — FENTANYL CITRATE (PF) 100 MCG/2ML IJ SOLN
50.0000 ug | INTRAMUSCULAR | Status: DC | PRN
  Administered 2021-06-16: 11:00:00 100 ug via INTRAVENOUS
  Filled 2021-06-16: qty 2

## 2021-06-16 MED ORDER — PRENATAL MULTIVITAMIN CH
1.0000 | ORAL_TABLET | Freq: Every day | ORAL | Status: DC
  Administered 2021-06-17 – 2021-06-18 (×2): 1 via ORAL
  Filled 2021-06-16 (×2): qty 1

## 2021-06-16 MED ORDER — ONDANSETRON HCL 4 MG/2ML IJ SOLN
4.0000 mg | Freq: Four times a day (QID) | INTRAMUSCULAR | Status: DC | PRN
  Administered 2021-06-16: 20:00:00 4 mg via INTRAVENOUS
  Filled 2021-06-16: qty 2

## 2021-06-16 MED ORDER — ONDANSETRON HCL 4 MG/2ML IJ SOLN: 4.0000 mg | INTRAMUSCULAR | Status: DC | PRN

## 2021-06-16 MED ORDER — OXYTOCIN-SODIUM CHLORIDE 30-0.9 UT/500ML-% IV SOLN
2.5000 [IU]/h | INTRAVENOUS | Status: DC
  Administered 2021-06-16: 21:00:00 2.5 [IU]/h via INTRAVENOUS
  Filled 2021-06-16: qty 500

## 2021-06-16 MED ORDER — LIDOCAINE HCL (PF) 1 % IJ SOLN: 30.0000 mL | INTRAMUSCULAR | Status: DC | PRN

## 2021-06-16 MED ORDER — SIMETHICONE 80 MG PO CHEW: 80.0000 mg | CHEWABLE_TABLET | ORAL | Status: DC | PRN

## 2021-06-16 MED ORDER — DIBUCAINE (PERIANAL) 1 % EX OINT: 1.0000 "application " | TOPICAL_OINTMENT | CUTANEOUS | Status: DC | PRN

## 2021-06-16 MED ORDER — OXYCODONE-ACETAMINOPHEN 5-325 MG PO TABS: 2.0000 | ORAL_TABLET | ORAL | Status: DC | PRN

## 2021-06-16 MED ORDER — DIPHENHYDRAMINE HCL 25 MG PO CAPS: 25.0000 mg | ORAL_CAPSULE | Freq: Four times a day (QID) | ORAL | Status: DC | PRN

## 2021-06-16 MED ORDER — ACETAMINOPHEN 325 MG PO TABS
650.0000 mg | ORAL_TABLET | ORAL | Status: DC | PRN
  Administered 2021-06-17: 10:00:00 650 mg via ORAL
  Filled 2021-06-16: qty 2

## 2021-06-16 NOTE — Anesthesia Procedure Notes (Signed)
Epidural Patient location during procedure: OB Start time: 06/16/2021 11:31 AM End time: 06/16/2021 11:45 AM  Staffing Anesthesiologist: Heather Roberts, MD Performed: anesthesiologist   Preanesthetic Checklist Completed: patient identified, IV checked, site marked, risks and benefits discussed, monitors and equipment checked, pre-op evaluation and timeout performed  Epidural Patient position: sitting Prep: DuraPrep Patient monitoring: heart rate, cardiac monitor, continuous pulse ox and blood pressure Approach: midline Location: L2-L3 Injection technique: LOR saline  Needle:  Needle type: Tuohy  Needle gauge: 17 G Needle length: 9 cm Needle insertion depth: 6 cm Catheter size: 20 Guage Catheter at skin depth: 11 cm Test dose: negative and Other  Assessment Events: blood not aspirated, injection not painful, no injection resistance and negative IV test  Additional Notes Informed consent obtained prior to proceeding including risk of failure, 1% risk of PDPH, risk of minor discomfort and bruising.  Discussed rare but serious complications including epidural abscess, permanent nerve injury, epidural hematoma.  Discussed alternatives to epidural analgesia and patient desires to proceed.  Timeout performed pre-procedure verifying patient name, procedure, and platelet count.  Patient tolerated procedure well.

## 2021-06-16 NOTE — Anesthesia Preprocedure Evaluation (Signed)
Anesthesia Evaluation  Patient identified by MRN, date of birth, ID band Patient awake    Reviewed: Allergy & Precautions, NPO status , Patient's Chart, lab work & pertinent test results  Airway Mallampati: II  TM Distance: >3 FB Neck ROM: Full    Dental no notable dental hx. (+) Dental Advisory Given   Pulmonary neg pulmonary ROS,    Pulmonary exam normal        Cardiovascular negative cardio ROS Normal cardiovascular exam     Neuro/Psych PSYCHIATRIC DISORDERS Anxiety Depression negative neurological ROS     GI/Hepatic negative GI ROS, Neg liver ROS,   Endo/Other  negative endocrine ROS  Renal/GU negative Renal ROS  negative genitourinary   Musculoskeletal negative musculoskeletal ROS (+)   Abdominal   Peds negative pediatric ROS (+)  Hematology negative hematology ROS (+)   Anesthesia Other Findings   Reproductive/Obstetrics (+) Pregnancy                             Anesthesia Physical Anesthesia Plan  ASA: 2  Anesthesia Plan: Epidural   Post-op Pain Management:    Induction:   PONV Risk Score and Plan:   Airway Management Planned: Natural Airway  Additional Equipment:   Intra-op Plan:   Post-operative Plan:   Informed Consent: I have reviewed the patients History and Physical, chart, labs and discussed the procedure including the risks, benefits and alternatives for the proposed anesthesia with the patient or authorized representative who has indicated his/her understanding and acceptance.       Plan Discussed with: Anesthesiologist  Anesthesia Plan Comments:         Anesthesia Quick Evaluation

## 2021-06-16 NOTE — Progress Notes (Signed)
Gail Wilson is a 25 y.o. G1P0 at [redacted]w[redacted]d by ultrasound admitted for induction of labor due to Elective at term.  Subjective: Feeling rectal pressure  Objective: BP 127/86    Pulse 75    Temp 98.9 F (37.2 C)    Resp 16    Ht 5\' 3"  (1.6 m)    Wt 86.8 kg    BMI 33.90 kg/m  No intake/output data recorded. No intake/output data recorded.  FHT:  FHR: 150 bpm, variability: moderate,  accelerations:  Present,  decelerations:  Present early UC:   regular, every 2 minutes SVE:   c/c/+1 Labs: Lab Results  Component Value Date   WBC 10.6 (H) 06/16/2021   HGB 10.4 (L) 06/16/2021   HCT 31.2 (L) 06/16/2021   MCV 91.0 06/16/2021   PLT 184 06/16/2021    Assessment / Plan: Induction of labor due to elective at term,  progressing well on pitocin  Labor: Progressing normally Preeclampsia:   n/a Fetal Wellbeing:  Category I Pain Control:  Epidural I/D:  n/a Anticipated MOD:  NSVD  06/18/2021 06/16/2021, 5:37 PM

## 2021-06-16 NOTE — H&P (Signed)
Gail Wilson is a 25 y.o. female G1 at [redacted]w[redacted]d presenting for elective IOL.  Patient received first dose of cytotec at 0415 today.  She is feeling mild CTX.  Active FM.  Antepartum course complicated by anxiety and h/o ADHD; stable on no medication.  GBS negative.  OB History     Gravida  1   Para      Term      Preterm      AB      Living         SAB      IAB      Ectopic      Multiple      Live Births             Past Medical History:  Diagnosis Date   ADHD (attention deficit hyperactivity disorder)    Anxiety    Chronic kidney disease    frequent UTI's   Depression    Headache    Past Surgical History:  Procedure Laterality Date   NO PAST SURGERIES     Family History: family history includes Cancer in her maternal grandmother. Social History:  reports that she has never smoked. She has never used smokeless tobacco. She reports that she does not drink alcohol and does not use drugs.     Maternal Diabetes: No Genetic Screening: Normal Maternal Ultrasounds/Referrals: Normal Fetal Ultrasounds or other Referrals:  None Maternal Substance Abuse:  No Significant Maternal Medications:  None Significant Maternal Lab Results:  Group B Strep negative Other Comments:  None  Review of Systems Maternal Medical History:  Fetal activity: Perceived fetal activity is normal.   Last perceived fetal movement was within the past hour.   Prenatal complications: no prenatal complications Prenatal Complications - Diabetes: none.  Dilation: 1 Effacement (%): 80 Station: -3 Exam by:: MHopkins RN Blood pressure 110/67, pulse 67, temperature 97.7 F (36.5 C), temperature source Oral, resp. rate 16, height 5\' 3"  (1.6 m), weight 86.8 kg. Maternal Exam:  Uterine Assessment: Contraction strength is mild.  Contraction frequency is irregular.  Abdomen: Patient reports no abdominal tenderness. Fundal height is c/w dates.   Estimated fetal weight is 7#6.     Fetal  Exam Fetal Monitor Review: Baseline rate: 125.  Variability: moderate (6-25 bpm).   Pattern: accelerations present and no decelerations.   Fetal State Assessment: Category I - tracings are normal.  Physical Exam Constitutional:      Appearance: Normal appearance.  HENT:     Head: Normocephalic and atraumatic.  Pulmonary:     Effort: Pulmonary effort is normal.  Abdominal:     Palpations: Abdomen is soft.  Musculoskeletal:        General: Normal range of motion.     Cervical back: Normal range of motion.  Skin:    General: Skin is warm and dry.  Neurological:     Mental Status: She is alert and oriented to person, place, and time.  Psychiatric:        Mood and Affect: Mood normal.        Behavior: Behavior normal.    Prenatal labs: ABO, Rh: --/--/A POS (12/20 0345) Antibody: NEG (12/20 0345) Rubella: Immune (05/25 0000) RPR:   NR HBsAg: Negative (05/25 0000)  HIV: Non-reactive (05/25 0000)  GBS: Negative/-- (11/29 0000)   Assessment/Plan: 25yo G1 at [redacted]w[redacted]d for elective IOL -Recheck cvx around 0815; cytotec #2 vs pitocin -CLEA when desired -Anticipate NSVD   [redacted]w[redacted]d 06/16/2021, 7:20 AM

## 2021-06-16 NOTE — Progress Notes (Signed)
Gail Wilson is a 25 y.o. G1P0 at [redacted]w[redacted]d by ultrasound admitted for induction of labor due to Elective at term.  Subjective: Comfortable with CLEA  Objective: BP 124/74    Pulse 60    Temp 97.7 F (36.5 C) (Oral)    Resp 16    Ht 5\' 3"  (1.6 m)    Wt 86.8 kg    BMI 33.90 kg/m  No intake/output data recorded. No intake/output data recorded.  FHT:  FHR: 130 bpm, variability: moderate,  accelerations:  Present,  decelerations:  Absent UC:   regular, every 2 minutes SVE:  5/90/-1  Labs: Lab Results  Component Value Date   WBC 10.6 (H) 06/16/2021   HGB 10.4 (L) 06/16/2021   HCT 31.2 (L) 06/16/2021   MCV 91.0 06/16/2021   PLT 184 06/16/2021    Assessment / Plan: Induction of labor due to elective at term,  progressing well on pitocin  Labor: Progressing normally Preeclampsia:   n/a Fetal Wellbeing:  Category I Pain Control:  Epidural I/D:  n/a Anticipated MOD:  NSVD  06/18/2021 06/16/2021, 1:51 PM

## 2021-06-16 NOTE — Progress Notes (Signed)
Gail Wilson is a 25 y.o. G1P0 at [redacted]w[redacted]d by ultrasound admitted for induction of labor due to Elective at term.  Subjective: Starting to feel ctx.  Objective: BP 110/67    Pulse 67    Temp 97.7 F (36.5 C) (Oral)    Resp 16    Ht 5\' 3"  (1.6 m)    Wt 86.8 kg    BMI 33.90 kg/m  No intake/output data recorded. No intake/output data recorded.  FHT:  FHR: 150 bpm, variability: moderate,  accelerations:  Present,  decelerations:  Absent UC:   regular, every 2-3 minutes SVE:   Dilation: 3 Effacement (%): 70 Station: -2 Exam by:: Darreon Lutes AROM, clear  Labs: Lab Results  Component Value Date   WBC 10.6 (H) 06/16/2021   HGB 10.4 (L) 06/16/2021   HCT 31.2 (L) 06/16/2021   MCV 91.0 06/16/2021   PLT 184 06/16/2021    Assessment / Plan: Induction of labor due to elective at term,  progressing well on pitocin  Labor: Progressing normally Preeclampsia:   n/a Fetal Wellbeing:  Category I Pain Control:  Labor support without medications I/D:  n/a Anticipated MOD:  NSVD  06/18/2021 06/16/2021, 10:39 AM

## 2021-06-17 LAB — CBC
HCT: 26.7 % — ABNORMAL LOW (ref 36.0–46.0)
Hemoglobin: 8.9 g/dL — ABNORMAL LOW (ref 12.0–15.0)
MCH: 30.8 pg (ref 26.0–34.0)
MCHC: 33.3 g/dL (ref 30.0–36.0)
MCV: 92.4 fL (ref 80.0–100.0)
Platelets: 159 10*3/uL (ref 150–400)
RBC: 2.89 MIL/uL — ABNORMAL LOW (ref 3.87–5.11)
RDW: 14.1 % (ref 11.5–15.5)
WBC: 12.3 10*3/uL — ABNORMAL HIGH (ref 4.0–10.5)
nRBC: 0 % (ref 0.0–0.2)

## 2021-06-17 NOTE — Social Work (Signed)
CSW received consult for hx of Anxiety.  CSW met with MOB to offer support and complete assessment.    ° °CSW introduced self and role. CSW observed MOB performing skin to skin with infant 'Carson', FOB Christian present and friend bedside. MOB declined to speak in private, stating guests can remain in room for assessment. CSW informed MOB of reason for consult and assessed mood. MOB was engaging and present as she shared she is doing great. MOB reported she was diagnosed with general anxiety in 2014 and prescribed medication. MOB described the anxiety as "normal, first time mom anxiety". MOB stated she has never attended therapy to treat symptoms. MOB shared a strong support systems from FOB, family and friends. MOB denies any current SI or HI. ° °CSW provided education regarding the baby blues period versus PPD and provided resources. CSW provided the New Mom Checklist and encouraged MOB to self evaluate and contact a medical professional if symptoms are noted at any time.  ° °CSW provided review of Sudden Infant Death Syndrome (SIDS) precautions. MOB has all essentials, including a crib. MOB identified [facility] for follow-up care and denies any barriers to care. MOB reported no additional needs at this time.  ° °CSW identifies no further need for intervention and no barriers to discharge at this time. ° °Torra Pala, LCSWA °Clinical Social Work °[facility] °[phone] °

## 2021-06-17 NOTE — Progress Notes (Signed)
Post Partum Day 1 Subjective: no complaints, up ad lib, voiding, and tolerating PO  Objective: Blood pressure 119/83, pulse 77, temperature 97.7 F (36.5 C), temperature source Oral, resp. rate 17, height 5\' 3"  (1.6 m), weight 86.8 kg, SpO2 100 %.  Physical Exam:  General: alert Lochia: appropriate Uterine Fundus: firm Incision: healing well DVT Evaluation: No evidence of DVT seen on physical exam.  Recent Labs    06/16/21 0347 06/17/21 0546  HGB 10.4* 8.9*  HCT 31.2* 26.7*    Assessment/Plan: Plan for discharge tomorrow and Circumcision prior to discharge Baby not ready for circ - has not been seen by peds.   LOS: 1 day   06/19/21 06/17/2021, 9:40 AM

## 2021-06-17 NOTE — Progress Notes (Signed)
Notified Dr. Elon Spanner of elevated blood pressures this morning. Patient asymptomatic. Dr. Elon Spanner ordered to call MD if BP >140/90. Earl Gala, Linda Hedges [place]

## 2021-06-17 NOTE — Anesthesia Postprocedure Evaluation (Signed)
Anesthesia Post Note  Patient: Gail Wilson  Procedure(s) Performed: AN AD HOC LABOR EPIDURAL     Patient location during evaluation: Mother Baby Anesthesia Type: Epidural Level of consciousness: awake and alert Pain management: pain level controlled Vital Signs Assessment: post-procedure vital signs reviewed and stable Respiratory status: spontaneous breathing, nonlabored ventilation and respiratory function stable Cardiovascular status: stable Postop Assessment: no headache, no backache and epidural receding Anesthetic complications: no   No notable events documented.  Last Vitals:  Vitals:   06/17/21 0300 06/17/21 0620  BP: 110/70 119/83  Pulse: 77 77  Resp: 16 17  Temp: 36.8 C 36.5 C  SpO2: 98% 100%    Last Pain:  Vitals:   06/17/21 0620  TempSrc: Oral  PainSc: 0-No pain   Pain Goal:                   Clearnce Leja

## 2021-06-18 MED ORDER — IBUPROFEN 600 MG PO TABS: 600.0000 mg | ORAL_TABLET | Freq: Four times a day (QID) | ORAL | 0 refills | Status: DC

## 2021-06-18 MED ORDER — OXYCODONE-ACETAMINOPHEN 5-325 MG PO TABS: 1.0000 | ORAL_TABLET | ORAL | 0 refills | Status: DC | PRN

## 2021-06-18 NOTE — Discharge Summary (Signed)
Postpartum Discharge Summary  Date of Service 06/18/2021     Patient Name: Gail Wilson DOB: [birth year] MRN: [id number]  Date of admission: 06/16/2021 Delivery date:06/16/2021  Delivering provider: Linda Hedges  Date of discharge: 06/18/2021  Admitting diagnosis: Pregnancy [Z34.90] Intrauterine pregnancy: [redacted]w[redacted]d    Secondary diagnosis:  Principal Problem:   Pregnancy  Additional problems: none    Discharge diagnosis: Term Pregnancy Delivered                                              Post partum procedures: none Augmentation: AROM, Pitocin, and Cytotec Complications: None  Hospital course: Induction of Labor With Vaginal Delivery   25y.o. yo G1P0 at 348w6das admitted to the hospital 06/16/2021 for induction of labor.  Indication for induction: Gestational hypertension.  Patient had an uncomplicated labor course as follows: Membrane Rupture Time/Date: 10:20 AM ,06/16/2021   Delivery Method:Vaginal, Spontaneous  Episiotomy: None  Lacerations:  Vaginal;Labial  Details of delivery can be found in separate delivery note.  Patient had a routine postpartum course. Patient is discharged home 06/18/21.  Newborn Data: Birth date:06/16/2021  Birth time:8:17 PM  Gender:Female  Living status:Living  Apgars:8 ,9  Weight:3969 g   Magnesium Sulfate received: No BMZ received: No Rhophylac:N/A MMR:N/A T-DaP:Given prenatally Flu: N/A Transfusion:No  Physical exam  Vitals:   06/17/21 1100 06/17/21 1500 06/17/21 2339 06/18/21 0632  BP: 123/86 137/89 126/85 122/87  Pulse:   72 76  Resp:  _0 Temp:  97.9 F (36.6 C) 98.6 F (37 C) 98.1 F (36.7 C)  TempSrc:  Oral Oral Oral  SpO2:   99% 100%  Weight:      Height:       General: alert, cooperative, and no distress Lochia: appropriate Uterine Fundus: firm Incision: Healing well with no significant drainage DVT Evaluation: No evidence of DVT seen on physical exam. Labs: Lab Results  Component Value Date    WBC 12.3 (H) 06/17/2021   HGB 8.9 (L) 06/17/2021   HCT 26.7 (L) 06/17/2021   MCV 92.4 06/17/2021   PLT 159 06/17/2021   CMP Latest Ref Rng & Units 11/06/2017  Glucose 65 - 99 mg/dL 93  BUN 6 - 20 mg/dL 10  Creatinine 0.44 - 1.00 mg/dL 0.63  Sodium 135 - 145 mmol/L 136  Potassium 3.5 - 5.1 mmol/L 3.4(L)  Chloride 101 - 111 mmol/L 104  CO2 22 - 32 mmol/L 23  Calcium 8.9 - 10.3 mg/dL 8.5(L)  Total Protein 6.5 - 8.1 g/dL 7.0  Total Bilirubin 0.3 - 1.2 mg/dL 1.1  Alkaline Phos 38 - 126 U/L 32(L)  AST 15 - 41 U/L 14(L)  ALT 14 - 54 U/L 10(L)   Edinburgh Score: Edinburgh Postnatal Depression Scale Screening Tool 06/17/2021  I have been able to laugh and see the funny side of things. 0  I have looked forward with enjoyment to things. 0  I have blamed myself unnecessarily when things went wrong. 1  I have been anxious or worried for no good reason. 3  I have felt scared or panicky for no good reason. 1  Things have been getting on top of me. 1  I have been so unhappy that I have had difficulty sleeping. 0  I have felt sad or miserable. 0  I have been so unhappy that I have  been crying. 0  The thought of harming myself has occurred to me. 0  Edinburgh Postnatal Depression Scale Total 6      After visit meds:  Allergies as of 06/18/2021       Reactions   Amoxil [amoxicillin]    Penicillins         Medication List     STOP taking these medications    acetaminophen 500 MG tablet Commonly known as: TYLENOL   nitrofurantoin (macrocrystal-monohydrate) 100 MG capsule Commonly known as: MACROBID   ondansetron 4 MG tablet Commonly known as: ZOFRAN       TAKE these medications    ibuprofen 600 MG tablet Commonly known as: ADVIL Take 1 tablet (600 mg total) by mouth every 6 (six) hours.   oxyCODONE-acetaminophen 5-325 MG tablet Commonly known as: PERCOCET/ROXICET Take 1 tablet by mouth every 4 (four) hours as needed (pain scale 4-7).   PRENATAL PO Take 1 tablet  by mouth daily.         Discharge home in stable condition Infant Feeding: Breast Infant Disposition:home with mother Discharge instruction: per After Visit Summary and Postpartum booklet. Activity: Advance as tolerated. Pelvic rest for 6 weeks.  Diet: routine diet Anticipated Birth Control: Unsure Postpartum Appointment:6 weeks Additional Postpartum F/U: BP check 1 week Future Appointments:No future appointments. Follow up Visit:      06/18/2021 Cyril Mourning, MD

## 2021-06-18 NOTE — Lactation Note (Signed)
This note was copied from a baby's chart. Lactation Consultation Note  Patient Name: Gail Wilson Date: 06/18/2021 Reason for consult: Follow-up assessment;Primapara;Term;1st time breastfeeding Age:25 hours   Lactation Initial Consult:  Initially, mother had declined lactation services.  NP interested in discharging family today and asked me to visit with family.  Mother has been breast feeding and supplementing with her own EBM.  She had collected 100- 1 ml syringes prior to arriving at the hospital.  At the time of my visit baby "Gail Wilson" was not showing feeding cues.  Suggested mother latch so I could observe her technique.  Made some position changes and suggestions and "Gail Wilson" fed on/off for 10 minutes.  He was still fussy after this time and I suggested we feed him the remaining 4 mls of EBM that mother had collected in syringes.  "Gail Wilson" seemed more satisfied after receiving this amount.  Mother using the manual pump and I observed approximately 3 mls of EBM in the bottle when I left the room.  Feeding plan for after discharge will include breast feeding, supplementing with EBM and then post pumping for 15 minutes.  Mother will feed back any EBM she obtains to "Gail Wilson."  Discussed feeding at least every three hours due to 9% weight loss and sooner if he desires.  He has a return pediatric office visit tomorrow.  Parents both feel comfortable with this plan.  NP updated.  Mother has two DEBPs for home use.   Maternal Data    Feeding Mother's Current Feeding Choice: Breast Milk  LATCH Score Latch: Repeated attempts needed to sustain latch, nipple held in mouth throughout feeding, stimulation needed to elicit sucking reflex.  Audible Swallowing: A few with stimulation  Type of Nipple: Everted at rest and after stimulation  Comfort (Breast/Nipple): Soft / non-tender  Hold (Positioning): Assistance needed to correctly position infant at breast and maintain  latch.  LATCH Score: 7   Lactation Tools Discussed/Used    Interventions Interventions: Breast feeding basics reviewed;Assisted with latch;Skin to skin;Breast compression;Hand pump;Expressed milk;Position options;Support pillows;Adjust position;Education  Discharge Discharge Education: Engorgement and breast care Pump: Manual;Personal (Spectra) WIC Program: No  Consult Status Consult Status: Complete    Giovan Pinsky R Muaad Boehning 06/18/2021, 10:48 AM

## 2021-06-27 ENCOUNTER — Telehealth (HOSPITAL_COMMUNITY): Payer: Self-pay

## 2021-06-27 NOTE — Telephone Encounter (Signed)
"  I'm doing well. Breastfeeding is going well. I'm pumping and latching, mostly pumping becuase it hurts when he latches. I'm using a nipple shield." RN reviewed LC resources and spoke with patient about [facility] resource. Will email these resources to patient. Patient has no questions or cocerns about her healing.  "He's eating and gaining weight. He is doing well. He sleeps in a crib."  RN reviewed ABC's of safe sleep with patient. Patient declines any questions or concerns about baby.  EPDS score is 3.  Marcelino Duster [facility] 06/27/2021,[id number]

## 2022-03-19 ENCOUNTER — Other Ambulatory Visit: Payer: Self-pay | Admitting: Obstetrics & Gynecology

## 2022-03-19 DIAGNOSIS — N644 Mastodynia: Secondary | ICD-10-CM

## 2022-03-31 ENCOUNTER — Ambulatory Visit
Admission: RE | Admit: 2022-03-31 | Discharge: 2022-03-31 | Disposition: A | Discharge status: Home / Self Care | Payer: Managed Care, Other (non HMO) | Source: Ambulatory Visit | Attending: Obstetrics & Gynecology | Admitting: Obstetrics & Gynecology

## 2022-03-31 DIAGNOSIS — N644 Mastodynia: Secondary | ICD-10-CM

## 2022-06-18 ENCOUNTER — Ambulatory Visit: Payer: Managed Care, Other (non HMO) | Admitting: Family

## 2022-07-09 ENCOUNTER — Telehealth: Payer: Self-pay | Admitting: Nurse Practitioner

## 2022-07-09 DIAGNOSIS — F419 Anxiety disorder, unspecified: Secondary | ICD-10-CM | POA: Insufficient documentation

## 2022-07-09 DIAGNOSIS — J069 Acute upper respiratory infection, unspecified: Secondary | ICD-10-CM

## 2022-07-09 NOTE — Progress Notes (Signed)
E-Visit for Sore Throat  We are sorry that you are not feeling well.  Here is how we plan to help!  Your symptoms indicate a likely viral infection (Pharyngitis).   Pharyngitis is inflammation in the back of the throat which can cause a sore throat, scratchiness and sometimes difficulty swallowing.   Pharyngitis is typically caused by a respiratory virus and will just run its course.    In order to help relieve the symptoms of your sore throat we recommend treated your congestion with an over the counter medication like Mucinex or Dayquil.    Please keep in mind that your symptoms could last up to 10 days.  For throat pain, we recommend over the counter oral pain relief medications such as acetaminophen or aspirin, or anti-inflammatory medications such as ibuprofen or naproxen sodium.    Topical treatments such as oral throat lozenges or sprays may be used as needed.  Avoid close contact with loved ones, especially the very young and elderly.  Remember to wash your hands thoroughly throughout the day as this is the number one way to prevent the spread of infection and wipe down door knobs and counters with disinfectant.  After careful review of your answers, I would not recommend an antibiotic for your condition.  Antibiotics should not be used to treat conditions that we suspect are caused by viruses like the virus that causes the common cold or flu. However, some people can have Strep with atypical symptoms. You may need formal testing in clinic or office to confirm if your symptoms continue or worsen.  Providers prescribe antibiotics to treat infections caused by bacteria. Antibiotics are very powerful in treating bacterial infections when they are used properly.  To maintain their effectiveness, they should be used only when necessary.  Overuse of antibiotics has resulted in the development of super bugs that are resistant to treatment!    Home Care: Only take medications as instructed by  your medical team. Do not drink alcohol while taking these medications. A steam or ultrasonic humidifier can help congestion.  You can place a towel over your head and breathe in the steam from hot water coming from a faucet. Avoid close contacts especially the very young and the elderly. Cover your mouth when you cough or sneeze. Always remember to wash your hands.  Get Help Right Away If: You develop worsening fever or throat pain. You develop a severe head ache or visual changes. Your symptoms persist after you have completed your treatment plan.  Make sure you Understand these instructions. Will watch your condition. Will get help right away if you are not doing well or get worse.   Thank you for choosing an e-visit.  Your e-visit answers were reviewed by a board certified advanced clinical practitioner to complete your personal care plan. Depending upon the condition, your plan could have included both over the counter or prescription medications.  Please review your pharmacy choice. Make sure the pharmacy is open so you can pick up prescription now. If there is a problem, you may contact your provider through [employer] and have the prescription routed to another pharmacy.  Your safety is important to [place]. If you have drug allergies check your prescription carefully.   For the next 24 hours you can use MyChart to ask questions about today's visit, request a non-urgent call back, or ask for a work or school excuse. You will get an email in the next two days asking about your experience. I  hope that your e-visit has been valuable and will speed your recovery.   I spent approximately 5 minutes reviewing the patient's history, current symptoms and coordinating their care today.

## 2022-07-21 ENCOUNTER — Ambulatory Visit: Payer: Managed Care, Other (non HMO) | Admitting: Family

## 2022-08-17 ENCOUNTER — Ambulatory Visit: Payer: Self-pay | Admitting: Family Medicine

## 2022-08-25 ENCOUNTER — Ambulatory Visit: Payer: Commercial Managed Care - PPO | Admitting: Family Medicine

## 2022-08-25 ENCOUNTER — Other Ambulatory Visit (HOSPITAL_COMMUNITY)
Admission: RE | Admit: 2022-08-25 | Discharge: 2022-08-25 | Disposition: A | Discharge status: Home / Self Care | Payer: Commercial Managed Care - PPO | Source: Ambulatory Visit | Attending: Family Medicine | Admitting: Family Medicine

## 2022-08-25 ENCOUNTER — Encounter: Payer: Self-pay | Admitting: Family Medicine

## 2022-08-25 VITALS — BP 108/69 | HR 89 | Temp 98.2°F | Ht 63.0 in | Wt 157.1 lb

## 2022-08-25 DIAGNOSIS — F411 Generalized anxiety disorder: Secondary | ICD-10-CM | POA: Diagnosis not present

## 2022-08-25 DIAGNOSIS — Z23 Encounter for immunization: Secondary | ICD-10-CM

## 2022-08-25 DIAGNOSIS — Z202 Contact with and (suspected) exposure to infections with a predominantly sexual mode of transmission: Secondary | ICD-10-CM | POA: Insufficient documentation

## 2022-08-25 DIAGNOSIS — F339 Major depressive disorder, recurrent, unspecified: Secondary | ICD-10-CM | POA: Diagnosis not present

## 2022-08-25 DIAGNOSIS — Z0000 Encounter for general adult medical examination without abnormal findings: Secondary | ICD-10-CM

## 2022-08-25 DIAGNOSIS — Z1159 Encounter for screening for other viral diseases: Secondary | ICD-10-CM

## 2022-08-25 DIAGNOSIS — Z114 Encounter for screening for human immunodeficiency virus [HIV]: Secondary | ICD-10-CM | POA: Diagnosis not present

## 2022-08-25 DIAGNOSIS — F900 Attention-deficit hyperactivity disorder, predominantly inattentive type: Secondary | ICD-10-CM

## 2022-08-25 MED ORDER — CITALOPRAM HYDROBROMIDE 10 MG PO TABS: 10.0000 mg | ORAL_TABLET | Freq: Every day | ORAL | 3 refills | Status: DC

## 2022-08-25 NOTE — Progress Notes (Signed)
Chief Complaint  Patient presents with   New Patient (Initial Visit)    Referral psy. Has depression problems as well as constant hunger.  Also problems with sleep Labs today Eating problems to discuss STD     Well Woman Gail Wilson is here for a complete physical.   Her last physical was >1 year ago.  Current diet: in general, a "healthy" diet. Current exercise: none. Fatigue out of ordinary? No Seatbelt? Yes  Health Maintenance Pap/HPV- Yes Tetanus- unsure HIV screening- Checking today Hep C screening- Checking today  Patient has a longstanding history of anxiety/depression.  She recently found out her fianc was cheating on her.  She had a call off her wedding and this has been a major source of stress for her.  She has been on Lexapro, BuSpar, Wellbutrin but they did not help. Not following with a counselor.  No homicidal or suicidal ideation.  No self-medication.  She feels her ADHD symptoms are also flaring.  She was diagnosed when she was much younger.  Psychiatry saw her at one point and wanted to have a formal evaluation which she did not officially do.  She was on Concerta in high school, that worked pretty well for her grades.  She does not remember how Strattera worked.  Past Medical History:  Diagnosis Date   ADHD (attention deficit hyperactivity disorder)    Anxiety    Depression      Past Surgical History:  Procedure Laterality Date   NO PAST SURGERIES      Medications  Current Outpatient Medications on File Prior to Visit  Medication Sig Dispense Refill   nitrofurantoin, macrocrystal-monohydrate, (MACROBID) 100 MG capsule Take 100 mg by mouth daily as needed.      Allergies Allergies  Allergen Reactions   Amoxil [Amoxicillin]    Penicillins     When she was young, possible a rash    Review of Systems: Constitutional:  no unexpected weight changes Eye:  no recent significant change in vision Ear/Nose/Mouth/Throat:  Ears:  no tinnitus or  vertigo and no recent change in hearing Nose/Mouth/Throat:  no complaints of nasal congestion, no sore throat Cardiovascular: no chest pain Respiratory:  no cough and no shortness of breath Gastrointestinal:  no abdominal pain, no change in bowel habits GU:  Female: negative for dysuria or pelvic pain Musculoskeletal/Extremities:  no pain of the joints Integumentary (Skin/Breast):  no new abnormal skin lesions reported Neurologic:  no headaches Endocrine:  denies fatigue out of ordinary Hematologic/Lymphatic:  No areas of easy bleeding  Exam BP 108/69 (BP Location: Left Arm, Patient Position: Sitting, Cuff Size: Normal)   Pulse 89   Temp 98.2 F (36.8 C) (Oral)   Ht '5\' 3"'$  (1.6 m)   Wt 157 lb 2 oz (71.3 kg)   SpO2 99%   BMI 27.83 kg/m  General:  well developed, well nourished, in no apparent distress Skin:  no significant moles, warts, or growths Head:  no masses, lesions, or tenderness Eyes:  pupils equal and round, sclera anicteric without injection Ears:  canals without lesions, TMs shiny without retraction, no obvious effusion, no erythema Nose:  nares patent, mucosa normal, and no drainage  Throat/Pharynx:  lips and gingiva without lesion; tongue and uvula midline; non-inflamed pharynx; no exudates or postnasal drainage Neck: neck supple without adenopathy, thyromegaly, or masses Lungs:  clear to auscultation, breath sounds equal bilaterally, no respiratory distress Cardio:  regular rate and rhythm, no bruits, no LE edema Abdomen:  abdomen soft,  nontender; bowel sounds normal; no masses or organomegaly Genital: Defer to GYN Musculoskeletal:  symmetrical muscle groups noted without atrophy or deformity Extremities:  no clubbing, cyanosis, or edema, no deformities, no skin discoloration Neuro:  gait normal; deep tendon reflexes normal and symmetric Psych: Age appropriate judgment/insight.  She did become tearful during the exam  Assessment and Plan  Well adult exam - Plan:  CBC, Comprehensive metabolic panel, Hemoglobin A1c, Lipid panel  Attention deficit hyperactivity disorder (ADHD), predominantly inattentive type - Plan: Ambulatory referral to Psychology  Screening for HIV without presence of risk factors - Plan: HIV Antibody (routine testing w rflx)  Encounter for hepatitis C screening test for low risk patient - Plan: Hepatitis C antibody  Exposure to STD - Plan: Cervicovaginal ancillary only( [place])  GAD (generalized anxiety disorder)  Depression, recurrent ([place])  Need for Tdap vaccination - Plan: Tdap vaccine greater than or equal to 54yo IM   Well 27 y.o. female. Counseled on diet and exercise. Other orders as above. Screen for hep C/HIV. Tetanus shot today. GAD/Depression: Chronic, uncontrolled.  Counseling information provided.  Counseled on exercise helping with this.  Start Celexa 10 mg daily.  Follow-up in 5 weeks.  For her potential ADHD, we will refer to behavioral health for a formal evaluation first.  I will see how she is doing next month and if improving with anxiety/depression, we can consider a stimulant while she awaits formal evaluation. The patient voiced understanding and agreement to the plan.  [place], DO 08/25/22 4:54 PM

## 2022-08-25 NOTE — Patient Instructions (Signed)
Please consider counseling. Contact [phone] to schedule an appointment or inquire about cost/insurance coverage.  [facility] located at [place], [place], [place].  Phone number = [phone].  Dr. Lennice Sites - Adult Psychiatry.    [facility] located at [place], [place], [place]. Phone number = [phone].   [facility] located at [address] [place], [place].  Phone number = [phone].   [facility] located at [place], [place], [place].  Phone number = [phone].  Give [place] 2-3 business days to get the results of your labs back.   Keep the diet clean and stay active.  Aim to do some physical exertion for 150 minutes per week. This is typically divided into 5 days per week, 30 minutes per day. The activity should be enough to get your heart rate up. Anything is better than nothing if you have time constraints.  Let us know if you need anything.

## 2022-08-26 ENCOUNTER — Encounter: Payer: Self-pay | Admitting: Family Medicine

## 2022-08-26 LAB — CBC
HCT: 39.5 % (ref 36.0–46.0)
Hemoglobin: 13.7 g/dL (ref 12.0–15.0)
MCHC: 34.6 g/dL (ref 30.0–36.0)
MCV: 93.5 fl (ref 78.0–100.0)
Platelets: 236 10*3/uL (ref 150.0–400.0)
RBC: 4.22 Mil/uL (ref 3.87–5.11)
RDW: 13.1 % (ref 11.5–15.5)
WBC: 7.8 10*3/uL (ref 4.0–10.5)

## 2022-08-26 LAB — LIPID PANEL
Cholesterol: 142 mg/dL (ref 0–200)
HDL: 47.5 mg/dL (ref >39.00)
LDL Cholesterol: 81 mg/dL (ref 0–99)
NonHDL: 94.46
Total CHOL/HDL Ratio: 3
Triglycerides: 67 mg/dL (ref 0.0–149.0)
VLDL: 13.4 mg/dL (ref 0.0–40.0)

## 2022-08-26 LAB — COMPREHENSIVE METABOLIC PANEL
ALT: 8 U/L (ref 0–35)
AST: 10 U/L (ref 0–37)
Albumin: 4.2 g/dL (ref 3.5–5.2)
Alkaline Phosphatase: 40 U/L (ref 39–117)
BUN: 15 mg/dL (ref 6–23)
CO2: 27 mEq/L (ref 19–32)
Calcium: 9.6 mg/dL (ref 8.4–10.5)
Chloride: 102 mEq/L (ref 96–112)
Creatinine, Ser: 0.91 mg/dL (ref 0.40–1.20)
GFR: 87.12 mL/min (ref >60.00)
Glucose, Bld: 83 mg/dL (ref 70–99)
Potassium: 4.2 mEq/L (ref 3.5–5.1)
Sodium: 138 mEq/L (ref 135–145)
Total Bilirubin: 0.5 mg/dL (ref 0.2–1.2)
Total Protein: 7.2 g/dL (ref 6.0–8.3)

## 2022-08-26 LAB — HEMOGLOBIN A1C: Hgb A1c MFr Bld: 4.5 % — ABNORMAL LOW (ref 4.6–6.5)

## 2022-08-26 LAB — HIV ANTIBODY (ROUTINE TESTING W REFLEX): HIV 1&2 Ab, 4th Generation: NONREACTIVE

## 2022-08-26 LAB — HEPATITIS C ANTIBODY: Hepatitis C Ab: NONREACTIVE

## 2022-08-27 LAB — CERVICOVAGINAL ANCILLARY ONLY
Chlamydia: NEGATIVE
Comment: NEGATIVE
Comment: NEGATIVE
Comment: NORMAL
Neisseria Gonorrhea: NEGATIVE
Trichomonas: NEGATIVE

## 2022-09-13 ENCOUNTER — Encounter: Payer: Self-pay | Admitting: Family Medicine

## 2022-09-14 ENCOUNTER — Other Ambulatory Visit: Payer: Self-pay | Admitting: Family Medicine

## 2022-09-14 MED ORDER — DOXEPIN HCL 10 MG PO CAPS: 10.0000 mg | ORAL_CAPSULE | Freq: Every evening | ORAL | Status: DC | PRN

## 2022-09-22 ENCOUNTER — Ambulatory Visit: Payer: Commercial Managed Care - PPO | Admitting: Family Medicine

## 2022-09-22 ENCOUNTER — Encounter: Payer: Self-pay | Admitting: Family Medicine

## 2022-09-22 VITALS — BP 108/62 | HR 83 | Temp 98.6°F | Ht 63.0 in | Wt 155.1 lb

## 2022-09-22 DIAGNOSIS — F339 Major depressive disorder, recurrent, unspecified: Secondary | ICD-10-CM | POA: Diagnosis not present

## 2022-09-22 DIAGNOSIS — F419 Anxiety disorder, unspecified: Secondary | ICD-10-CM

## 2022-09-22 MED ORDER — CITALOPRAM HYDROBROMIDE 10 MG PO TABS
10.0000 mg | ORAL_TABLET | Freq: Every day | ORAL | 2 refills | Status: DC
Start: 2022-09-22 — End: 2023-02-08

## 2022-09-22 NOTE — Progress Notes (Signed)
Chief Complaint  Patient presents with   Follow-up    Subjective Gail Wilson presents for f/u anxiety/depression.  Pt is currently being treated with Celexa 10 mg/d.  Reports doing 40% better since treatment. Some fatigue with it, but sleeps around 6 hrs/night No thoughts of harming self or others. No self-medication with alcohol, prescription drugs or illicit drugs. Pt is not following with a counselor/psychologist.  Past Medical History:  Diagnosis Date   ADHD (attention deficit hyperactivity disorder)    Anxiety    Depression    Allergies as of 09/22/2022       Reactions   Amoxil [amoxicillin]    Penicillins    When she was young, possible a rash        Medication List        Accurate as of September 22, 2022 10:45 AM. If you have any questions, ask your nurse or doctor.          STOP taking these medications    nitrofurantoin (macrocrystal-monohydrate) 100 MG capsule Commonly known as: MACROBID Stopped by: Shelda Pal, DO       TAKE these medications    citalopram 10 MG tablet Commonly known as: CELEXA Take 1 tablet (10 mg total) by mouth daily.   doxepin 10 MG capsule Commonly known as: SINEQUAN Take 1 capsule (10 mg total) by mouth at bedtime as needed.        Exam BP 108/62 (BP Location: Left Arm, Patient Position: Sitting, Cuff Size: Normal)   Pulse 83   Temp 98.6 F (37 C) (Oral)   Ht 5\' 3"  (1.6 m)   Wt 155 lb 2 oz (70.4 kg)   SpO2 98%   BMI 27.48 kg/m  General:  well developed, well nourished, in no apparent distress Lungs:  No respiratory distress Psych: well oriented with normal range of affect and age-appropriate judgement/insight, alert and oriented x4.  Assessment and Plan  Anxiety  Depression, recurrent ([place])  Chronic, stable currently. Cont Celexa 10 mg/d. Has ADHD eval in May.  F/u in 6 mo. The patient voiced understanding and agreement to the plan.  [place], DO 09/22/22 10:45 AM

## 2022-09-22 NOTE — Patient Instructions (Addendum)
Try to get at least 7 hrs of sleep nightly.   Stay active.   Let us know if you need anything.

## 2022-10-06 DIAGNOSIS — D485 Neoplasm of uncertain behavior of skin: Secondary | ICD-10-CM | POA: Diagnosis not present

## 2022-10-06 DIAGNOSIS — B079 Viral wart, unspecified: Secondary | ICD-10-CM | POA: Diagnosis not present

## 2022-11-12 ENCOUNTER — Ambulatory Visit: Payer: Commercial Managed Care - PPO | Admitting: Psychology

## 2022-11-12 DIAGNOSIS — F89 Unspecified disorder of psychological development: Secondary | ICD-10-CM

## 2022-11-12 NOTE — Progress Notes (Signed)
Date: Nov 12, 2022 Appointment Start Time: 9am Duration: 113 minutes Provider: Helmut Muster, PsyD Type of Session: Initial Appointment for Evaluation  Location of Patient: Home Location of Provider: Provider's Home (private office) Type of Contact: Microsoft Teams video visit with audio  Session Content:  Prior to proceeding with today's appointment, two pieces of identifying information were obtained from Gail Wilson to verify identity. In addition, Gail Wilson's physical location at the time of this appointment was obtained. In the event of technical difficulties, Gail Wilson shared a phone number she could be reached at. Gail Wilson and this provider participated in today's telepsychological service. Gail Wilson denied anyone else being present in the room or on the virtual appointment.  The provider's role was explained to Gail Wilson. The provider reviewed and discussed issues of confidentiality, privacy, and limits therein (e.g., reporting obligations). In addition to verbal informed consent, written informed consent for psychological services was obtained from Gail Wilson prior to the initial appointment. Written consent included information concerning the practice, financial arrangements, and confidentiality and patients' rights. Since the clinic is not a 24/7 crisis center, mental health emergency resources were shared, and the provider explained e-mail, voicemail, and/or other messaging systems should be utilized only for non-emergency reasons. This provider also explained that information obtained during appointments will be placed in their electronic medical record in a confidential manner. Gail Wilson verbally acknowledged understanding of the aforementioned and agreed to use mental health emergency resources discussed if needed. Moreover, Gail Wilson agreed information may be shared with other Gail Wilson or their referring provider(s) as needed for coordination of care. By signing the new patient documents, Gail Wilson  provided written consent for coordination of care. Gail Wilson verbally acknowledged understanding she is ultimately responsible for understanding her insurance benefits as it relates to reimbursement of telepsychological and in-person services. This provider also reviewed confidentiality, as it relates to telepsychological services, as well as the rationale for telepsychological services. This provider further explained that video should not be captured, photos should not be taken, nor should testing stimuli be copied or recorded as it would be a copyright violation. Gail Wilson expressed understanding of the aforementioned, and verbally consented to proceed.  Gail Wilson completed the neurobehavioral examination, which included obtaining a, family, social, and psychiatric history; integration of prior history and other sources of clinical data to assist with clinical decision making; behavioral observations; assessment of thinking, reasoning, and judgment; and establishment of a provisional diagnosis. The evaluation was completed in 113 minutes. Codes 16109 and [id number] were billed.   Mental Status Examination:  Appearance:  neat Behavior: appropriate to circumstances Mood: neutral Affect: mood congruent Speech: tangential  Eye Contact: appropriate Psychomotor Activity: restless Thought Process: denies suicidal, homicidal, and self-harm ideation, plan and intent Content/Perceptual Disturbances: none Orientation: AAOx4 Cognition/Sensorium:  occasionally distracted Insight: good Judgment: good  Provisional DSM-5 diagnosis(es):  F89 Unspecified Disorder of Psychological Development   Plan: Testing is expected to answer the question, does the individual meet criteria for ADHD when age, other mental health concerns (e.g., anxiety, OCD, OCPD, and trauma), and cognitive functioning are taken into consideration? Further testing is warranted because a diagnosis cannot be given solely based on current interview  data (further data is required). Testing results are expected to answer the remaining diagnostic questions in order to provide an accurate diagnosis and assist in treatment planning with an expectation of improved clinical outcome. Gail Wilson is currently scheduled for an appointment on 12/01/2022 at 12pm via Microsoft Teams video visit with audio.  Gail Gouge, PsyD

## 2022-11-30 DIAGNOSIS — F4323 Adjustment disorder with mixed anxiety and depressed mood: Secondary | ICD-10-CM | POA: Diagnosis not present

## 2022-11-30 NOTE — Progress Notes (Unsigned)
Date: 12/01/2022   Appointment Start Time: 12pm Duration: 75 minutes Provider: Helmut Muster, PsyD Type of Session: Testing Appointment for Evaluation  Location of Patient: Home Location of Provider: Provider's Home (private office) Type of Contact: Microsoft Teams video visit with audio  Session Content: Today's appointment was a telepsychological visit due to COVID-19. Gail Wilson is aware it is her responsibility to secure confidentiality on her end of the session. Prior to proceeding with today's appointment, Gail Wilson's physical location at the time of this appointment was obtained as well a phone number she could be reached at in the event of technical difficulties. Gail Wilson denied anyone else being present in the room or on the virtual appointment. This provider reviewed that video should not be captured, photos should not be taken, nor should testing stimuli be copied or recorded as it would be a copyright violation. Gail Wilson expressed understanding of the aforementioned, and verbally consented to proceed. The WAIS-IV was administered, scored, and interpreted by this evaluator  Billing codes will be input on the feedback appointment. There are no billing codes for the testing appointment.   Provisional DSM-5 diagnosis(es):  F89 Unspecified Disorder of Psychological Development   Plan: Gail Wilson was scheduled for a feedback appointment on 12/08/2022 at 12pm via Microsoft Teams video visit with audio.                Gail Gouge, PsyD

## 2022-12-01 ENCOUNTER — Ambulatory Visit: Payer: Commercial Managed Care - PPO | Admitting: Psychology

## 2022-12-01 DIAGNOSIS — F89 Unspecified disorder of psychological development: Secondary | ICD-10-CM

## 2022-12-07 DIAGNOSIS — F4323 Adjustment disorder with mixed anxiety and depressed mood: Secondary | ICD-10-CM | POA: Diagnosis not present

## 2022-12-08 ENCOUNTER — Ambulatory Visit (INDEPENDENT_AMBULATORY_CARE_PROVIDER_SITE_OTHER): Payer: Commercial Managed Care - PPO | Admitting: Psychology

## 2022-12-08 ENCOUNTER — Other Ambulatory Visit: Payer: Self-pay | Admitting: Family

## 2022-12-08 DIAGNOSIS — F902 Attention-deficit hyperactivity disorder, combined type: Secondary | ICD-10-CM

## 2022-12-08 DIAGNOSIS — N6320 Unspecified lump in the left breast, unspecified quadrant: Secondary | ICD-10-CM

## 2022-12-08 DIAGNOSIS — F4389 Other reactions to severe stress: Secondary | ICD-10-CM
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
## 2022-12-14 DIAGNOSIS — F4323 Adjustment disorder with mixed anxiety and depressed mood: Secondary | ICD-10-CM | POA: Diagnosis not present

## 2022-12-15 ENCOUNTER — Ambulatory Visit
Admission: RE | Admit: 2022-12-15 | Discharge: 2022-12-15 | Disposition: A | Discharge status: Home / Self Care | Payer: Commercial Managed Care - PPO | Source: Ambulatory Visit | Attending: Family | Admitting: Family

## 2022-12-15 DIAGNOSIS — N6320 Unspecified lump in the left breast, unspecified quadrant: Secondary | ICD-10-CM

## 2022-12-15 DIAGNOSIS — N644 Mastodynia: Secondary | ICD-10-CM | POA: Diagnosis not present

## 2022-12-15 DIAGNOSIS — N6342 Unspecified lump in left breast, subareolar: Secondary | ICD-10-CM | POA: Diagnosis not present

## 2022-12-18 ENCOUNTER — Other Ambulatory Visit: Payer: Self-pay | Admitting: Family Medicine

## 2022-12-21 DIAGNOSIS — F4323 Adjustment disorder with mixed anxiety and depressed mood: Secondary | ICD-10-CM | POA: Diagnosis not present

## 2022-12-24 ENCOUNTER — Ambulatory Visit: Payer: Commercial Managed Care - PPO | Admitting: Behavioral Health

## 2022-12-24 ENCOUNTER — Ambulatory Visit: Payer: Commercial Managed Care - PPO | Admitting: Psychology

## 2023-01-28 ENCOUNTER — Encounter: Payer: Self-pay | Admitting: Family Medicine

## 2023-02-03 ENCOUNTER — Other Ambulatory Visit: Payer: Self-pay | Admitting: Oncology

## 2023-02-03 DIAGNOSIS — Z006 Encounter for examination for normal comparison and control in clinical research program: Secondary | ICD-10-CM

## 2023-02-08 ENCOUNTER — Encounter: Payer: Self-pay | Admitting: Family Medicine

## 2023-02-08 ENCOUNTER — Ambulatory Visit: Payer: Commercial Managed Care - PPO | Admitting: Family Medicine

## 2023-02-08 VITALS — BP 108/68 | HR 68 | Temp 98.3°F | Ht 63.0 in | Wt 156.5 lb

## 2023-02-08 DIAGNOSIS — F900 Attention-deficit hyperactivity disorder, predominantly inattentive type: Secondary | ICD-10-CM | POA: Diagnosis not present

## 2023-02-08 MED ORDER — AMPHETAMINE-DEXTROAMPHET ER 15 MG PO CP24: 15.0000 mg | ORAL_CAPSULE | ORAL | 0 refills | Status: DC

## 2023-02-08 NOTE — Progress Notes (Signed)
Chief Complaint  Patient presents with   ADHD    Gail Wilson is 27 y.o. female here for ADHD follow up.  Patient is not on anything.  Symptoms include inattention.  Was on Concerta in the past, made her worked up. Strattera did not work.   Past Medical History:  Diagnosis Date   ADHD (attention deficit hyperactivity disorder)    Anxiety    Depression     BP 108/68 (BP Location: Left Arm, Patient Position: Sitting, Cuff Size: Normal)   Pulse 68   Temp 98.3 F (36.8 C) (Oral)   Ht 5\' 3"  (1.6 m)   Wt 156 lb 8 oz (71 kg)   SpO2 99%   BMI 27.72 kg/m  Gen- awake, alert, appearing stated age Heart- RRR Lungs- CTAB, no accessory muscle use Neuro- no facial tics Psych- age appropriate judgment and insight, normal mood and affect  Attention deficit hyperactivity disorder (ADHD), predominantly inattentive type - Plan: amphetamine-dextroamphetamine (ADDERALL XR) 15 MG 24 hr capsule  Chronic, not controlled. Start Adderall XR 15 mg/d, f/u in 1 mo.  Pt voiced understanding and agreement to the plan.  Jilda Roche [place], DO 02/08/23 4:18 PM

## 2023-02-08 NOTE — Patient Instructions (Signed)
Let me know if there are cost/supply issues.  Let us know if you need anything.  

## 2023-03-15 ENCOUNTER — Telehealth (INDEPENDENT_AMBULATORY_CARE_PROVIDER_SITE_OTHER): Payer: Commercial Managed Care - PPO | Admitting: Family Medicine

## 2023-03-15 ENCOUNTER — Encounter: Payer: Self-pay | Admitting: Family Medicine

## 2023-03-15 DIAGNOSIS — F900 Attention-deficit hyperactivity disorder, predominantly inattentive type: Secondary | ICD-10-CM | POA: Diagnosis not present

## 2023-03-15 MED ORDER — AMPHETAMINE-DEXTROAMPHET ER 15 MG PO CP24: 15.0000 mg | ORAL_CAPSULE | ORAL | 0 refills | Status: DC

## 2023-03-15 NOTE — Progress Notes (Signed)
Chief Complaint  Patient presents with   medication followup    Gail Wilson is 27 y.o. female here for ADHD follow up. We are interacting via web portal for an electronic face-to-face visit. I verified patient's ID using 2 identifiers. Patient agreed to proceed with visit via this method. Patient is at home, I am at office. Patient and I are present for visit.   Patient is currently on Adderall XR 15 mg/d and compliance is excellent. Symptoms are now well controlled.  Side effects include insomnia, constipation. Patient believes their dose should be not significantly changed. Denies tics, weight loss, difficulties with sleep, self-medication, alcohol/drug abuse, chest pain, or palpitations.  Past Medical History:  Diagnosis Date   ADHD (attention deficit hyperactivity disorder)    Anxiety    Depression    Objective No conversational dyspnea Age appropriate judgment and insight Nml affect and mood  Attention deficit hyperactivity disorder (ADHD), predominantly inattentive type - Plan: amphetamine-dextroamphetamine (ADDERALL XR) 15 MG 24 hr capsule  Chronic, stable.  Continue Adderall 15 mg daily.  Needs to increase hydration status.  Consider taking Metamucil and magnesium supplementation if still having constipation issues. F/u in 6 mo for CPE. Pt voiced understanding and agreement to the plan.  Jilda Roche [place], DO 03/15/23 3:11 PM

## 2023-03-21 ENCOUNTER — Other Ambulatory Visit: Payer: Self-pay | Admitting: Family Medicine

## 2023-03-21 ENCOUNTER — Encounter: Payer: Self-pay | Admitting: Family Medicine

## 2023-03-21 ENCOUNTER — Telehealth: Payer: Self-pay | Admitting: Family Medicine

## 2023-03-21 DIAGNOSIS — F900 Attention-deficit hyperactivity disorder, predominantly inattentive type: Secondary | ICD-10-CM

## 2023-03-21 MED ORDER — AMPHETAMINE-DEXTROAMPHETAMINE 15 MG PO TABS: 15.0000 mg | ORAL_TABLET | Freq: Every day | ORAL | 0 refills | Status: DC

## 2023-03-21 NOTE — Telephone Encounter (Signed)
Pt said she needs the short acting

## 2023-03-21 NOTE — Telephone Encounter (Signed)
Called left message to call back

## 2023-03-21 NOTE — Telephone Encounter (Signed)
Patient Comment: I have 2 scripts that have fill dates for october and november but none available for september. My last fill was on 02/08/23

## 2023-03-21 NOTE — Telephone Encounter (Signed)
Had another note on this refill

## 2023-03-21 NOTE — Telephone Encounter (Signed)
Pt's medication amphetamine-dextroamphetamine (ADDERALL XR) 15 MG  is on backorder. Pharmacy has the tablets but not the extended release. Please send updated script to [facility] ([address]) and call pt to advise.

## 2023-03-21 NOTE — Addendum Note (Signed)
Addended by: Radene Gunning on: 03/21/2023 04:29 PM   Modules accepted: Orders

## 2023-05-12 ENCOUNTER — Other Ambulatory Visit (HOSPITAL_BASED_OUTPATIENT_CLINIC_OR_DEPARTMENT_OTHER): Payer: Self-pay

## 2023-05-15 ENCOUNTER — Telehealth: Payer: Commercial Managed Care - PPO | Admitting: Family

## 2023-05-15 DIAGNOSIS — M545 Low back pain, unspecified: Secondary | ICD-10-CM | POA: Diagnosis not present

## 2023-05-15 MED ORDER — NAPROXEN 500 MG PO TABS: 500.0000 mg | ORAL_TABLET | Freq: Two times a day (BID) | ORAL | 0 refills | Status: DC

## 2023-05-15 MED ORDER — BACLOFEN 10 MG PO TABS: 10.0000 mg | ORAL_TABLET | Freq: Three times a day (TID) | ORAL | 0 refills | Status: DC

## 2023-05-15 NOTE — Progress Notes (Signed)
E-Visit for Back Pain   We are sorry that you are not feeling well.  Here is how we plan to help!  Based on what you have shared with me it looks like you mostly have acute back pain.  Acute back pain is defined as musculoskeletal pain that can resolve in 1-3 weeks with conservative treatment.  I have prescribed Naprosyn 500 mg take one by mouth twice a day non-steroid anti-inflammatory (NSAID) as well as Baclofen 10 mg every eight hours as needed which is a muscle relaxer  Some patients experience stomach irritation or in increased heartburn with anti-inflammatory drugs.  Please keep in mind that muscle relaxer's can cause fatigue and should not be taken while at work or driving.  Back pain is very common.  The pain often gets better over time.  The cause of back pain is usually not dangerous.  Most people can learn to manage their back pain on their own.  Home Care Stay active.  Start with short walks on flat ground if you can.  Try to walk farther each day. Do not sit, drive or stand in one place for more than 30 minutes.  Do not stay in bed. Do not avoid exercise or work.  Activity can help your back heal faster. Be careful when you bend or lift an object.  Bend at your knees, keep the object close to you, and do not twist. Sleep on a firm mattress.  Lie on your side, and bend your knees.  If you lie on your back, put a pillow under your knees. Only take medicines as told by your doctor. Put ice on the injured area. Put ice in a plastic bag Place a towel between your skin and the bag Leave the ice on for 15-20 minutes, 3-4 times a day for the first 2-3 days. 210 After that, you can switch between ice and heat packs. Ask your doctor about back exercises or massage. Avoid feeling anxious or stressed.  Find good ways to deal with stress, such as exercise.  Get Help Right Way If: Your pain does not go away with rest or medicine. Your pain does not go away in 1 week. You have new  problems. You do not feel well. The pain spreads into your legs. You cannot control when you poop (bowel movement) or pee (urinate) You feel sick to your stomach (nauseous) or throw up (vomit) You have belly (abdominal) pain. You feel like you may pass out (faint). If you develop a fever.  Make Sure you: Understand these instructions. Will watch your condition Will get help right away if you are not doing well or get worse.  Your e-visit answers were reviewed by a board certified advanced clinical practitioner to complete your personal care plan.  Depending on the condition, your plan could have included both over the counter or prescription medications.  If there is a problem please reply  once you have received a response from your provider.  Your safety is important to [place].  If you have drug allergies check your prescription carefully.    You can use MyChart to ask questions about today's visit, request a non-urgent call back, or ask for a work or school excuse for 24 hours related to this e-Visit. If it has been greater than 24 hours you will need to follow up with your provider, or enter a new e-Visit to address those concerns.  You will get an e-mail in the next two days asking about  your experience.  I hope that your e-visit has been valuable and will speed your recovery. Thank you for using e-visits.   Approximately 5 minutes was spent documenting and reviewing patient's chart.

## 2023-05-18 ENCOUNTER — Telehealth: Payer: Commercial Managed Care - PPO | Admitting: Family Medicine

## 2023-05-18 DIAGNOSIS — Z6825 Body mass index (BMI) 25.0-25.9, adult: Secondary | ICD-10-CM | POA: Diagnosis not present

## 2023-05-18 DIAGNOSIS — Z01419 Encounter for gynecological examination (general) (routine) without abnormal findings: Secondary | ICD-10-CM | POA: Diagnosis not present

## 2023-05-25 ENCOUNTER — Other Ambulatory Visit: Payer: Self-pay | Admitting: Family Medicine

## 2023-05-25 ENCOUNTER — Other Ambulatory Visit (HOSPITAL_BASED_OUTPATIENT_CLINIC_OR_DEPARTMENT_OTHER): Payer: Self-pay

## 2023-05-25 MED ORDER — AMPHETAMINE-DEXTROAMPHET ER 15 MG PO CP24
15.0000 mg | ORAL_CAPSULE | ORAL | 0 refills | Status: DC
  Filled 2023-09-01 – 2023-09-20 (×2): qty 30, 30d supply, fill #0

## 2023-05-25 MED ORDER — AMPHETAMINE-DEXTROAMPHET ER 15 MG PO CP24
15.0000 mg | ORAL_CAPSULE | ORAL | 0 refills | Status: DC
  Filled 2023-07-13: qty 30, 30d supply, fill #0

## 2023-05-25 MED ORDER — AMPHETAMINE-DEXTROAMPHET ER 15 MG PO CP24
15.0000 mg | ORAL_CAPSULE | ORAL | 0 refills | Status: DC
  Filled 2023-05-25: qty 30, 30d supply, fill #0

## 2023-05-25 NOTE — Telephone Encounter (Signed)
Requesting: Adderall XR 15mg   Contract: None UDS: None Last Visit: 02/08/23 Next Visit: None Last Refill: 05/14/23 #30 and 0RF   Please Advise

## 2023-06-02 ENCOUNTER — Other Ambulatory Visit (HOSPITAL_COMMUNITY): Payer: Self-pay

## 2023-06-28 ENCOUNTER — Other Ambulatory Visit (HOSPITAL_BASED_OUTPATIENT_CLINIC_OR_DEPARTMENT_OTHER): Payer: Self-pay

## 2023-07-11 DIAGNOSIS — N921 Excessive and frequent menstruation with irregular cycle: Secondary | ICD-10-CM | POA: Diagnosis not present

## 2023-07-13 ENCOUNTER — Other Ambulatory Visit (HOSPITAL_BASED_OUTPATIENT_CLINIC_OR_DEPARTMENT_OTHER): Payer: Self-pay

## 2023-07-18 ENCOUNTER — Other Ambulatory Visit (HOSPITAL_BASED_OUTPATIENT_CLINIC_OR_DEPARTMENT_OTHER): Payer: Self-pay

## 2023-07-18 MED ORDER — PROGESTERONE 200 MG PO CAPS
200.0000 mg | ORAL_CAPSULE | Freq: Every day | ORAL | 3 refills | Status: DC
  Filled 2023-07-18: qty 45, 45d supply, fill #0

## 2023-09-01 ENCOUNTER — Other Ambulatory Visit (HOSPITAL_BASED_OUTPATIENT_CLINIC_OR_DEPARTMENT_OTHER): Payer: Self-pay

## 2023-09-06 ENCOUNTER — Ambulatory Visit: Admitting: Family Medicine

## 2023-09-14 ENCOUNTER — Other Ambulatory Visit (HOSPITAL_BASED_OUTPATIENT_CLINIC_OR_DEPARTMENT_OTHER): Payer: Self-pay

## 2023-09-14 DIAGNOSIS — B078 Other viral warts: Secondary | ICD-10-CM | POA: Diagnosis not present

## 2023-09-20 ENCOUNTER — Other Ambulatory Visit (HOSPITAL_BASED_OUTPATIENT_CLINIC_OR_DEPARTMENT_OTHER): Payer: Self-pay

## 2023-09-28 ENCOUNTER — Ambulatory Visit: Admitting: Family Medicine

## 2023-09-28 ENCOUNTER — Encounter: Payer: Self-pay | Admitting: Family Medicine

## 2023-09-28 ENCOUNTER — Other Ambulatory Visit (HOSPITAL_BASED_OUTPATIENT_CLINIC_OR_DEPARTMENT_OTHER): Payer: Self-pay

## 2023-09-28 VITALS — BP 116/70 | HR 77 | Ht 63.0 in | Wt 138.4 lb

## 2023-09-28 DIAGNOSIS — N939 Abnormal uterine and vaginal bleeding, unspecified: Secondary | ICD-10-CM

## 2023-09-28 DIAGNOSIS — Z79899 Other long term (current) drug therapy: Secondary | ICD-10-CM | POA: Diagnosis not present

## 2023-09-28 DIAGNOSIS — F900 Attention-deficit hyperactivity disorder, predominantly inattentive type: Secondary | ICD-10-CM | POA: Diagnosis not present

## 2023-09-28 MED ORDER — AMPHETAMINE-DEXTROAMPHET ER 15 MG PO CP24
15.0000 mg | ORAL_CAPSULE | ORAL | 0 refills | Status: DC
  Filled 2024-03-05 – 2024-03-15 (×2): qty 30, 30d supply, fill #0

## 2023-09-28 MED ORDER — AMPHETAMINE-DEXTROAMPHETAMINE 15 MG PO TABS
15.0000 mg | ORAL_TABLET | Freq: Every day | ORAL | 0 refills | Status: DC | PRN
  Filled 2023-09-28 – 2023-11-03 (×2): qty 30, 30d supply, fill #0

## 2023-09-28 MED ORDER — AMPHETAMINE-DEXTROAMPHET ER 15 MG PO CP24
15.0000 mg | ORAL_CAPSULE | ORAL | 0 refills | Status: DC
  Filled 2024-01-03: qty 30, 30d supply, fill #0

## 2023-09-28 MED ORDER — AMPHETAMINE-DEXTROAMPHET ER 15 MG PO CP24
15.0000 mg | ORAL_CAPSULE | ORAL | 0 refills | Status: DC
  Filled 2023-09-28 – 2023-11-03 (×2): qty 30, 30d supply, fill #0

## 2023-09-28 NOTE — Patient Instructions (Signed)
 Reach out to your GYN.   A consideration we could take is stopping the progesterone for 1 week and then trying a stronger one for a few days and causing a withdrawal bleed. Send me a message.  Give [place] 2-3 business days to get the results of your labs back.   Let us know if you need anything.

## 2023-09-28 NOTE — Progress Notes (Signed)
 Chief Complaint  Patient presents with   ADHD    Patient presents today for ADHD follow-up    Gail Wilson is 28 y.o. female here for ADHD follow up.  Patient is currently on Adderall XR 15 mg daily and compliance is excellent. Symptoms are well-controlled on the medication. Side effects include: None. Patient believes their dose should be not significantly changed. Denies tics, weight loss, difficulties with sleep, self-medication, alcohol/drug abuse, chest pain, or palpitations.  Patient has been dealing with abnormal uterine bleeding.  She is working with the gynecology team.  She is currently on Prometrium.  She is still having some bleeding.  She had an ultrasound.  Past Medical History:  Diagnosis Date   ADHD (attention deficit hyperactivity disorder)    Anxiety    Depression     BP 116/70   Pulse 77   Ht 5\' 3"  (1.6 m)   Wt 138 lb 6.4 oz (62.8 kg)   LMP 09/28/2023 (Exact Date)   SpO2 99%   Breastfeeding No   BMI 24.52 kg/m  Gen- awake, alert, appearing stated age Heart- RRR Lungs- CTAB, no accessory muscle use Neuro- no facial tics Psych- age appropriate judgment and insight, normal mood and affect  Attention deficit hyperactivity disorder (ADHD), predominantly inattentive type - Plan: amphetamine-dextroamphetamine (ADDERALL XR) 15 MG 24 hr capsule, amphetamine-dextroamphetamine (ADDERALL XR) 15 MG 24 hr capsule, amphetamine-dextroamphetamine (ADDERALL XR) 15 MG 24 hr capsule, amphetamine-dextroamphetamine (ADDERALL) 15 MG tablet, Drug Monitoring Panel [phone] , Urine  Abnormal uterine bleeding (AUB) - Plan: CBC, Basic metabolic panel with GFR, TSH, FSH, LH  Chronic, not fully stable.  Continue Adderall XR 50 mg daily during the week.  Will send in a tablet for afternoon use and also for weekend usage where she does not need it as long.  Update CSA and UDS today.  Send message if not doing well. Appreciate gynecology input.  Check above labs.  Could consider  stopping Prometrium for a week and if still having bleeding, will send in Aygestin. F/u in 6 mo for CPE. Pt voiced understanding and agreement to the plan.  Jilda Roche [place], DO 09/28/23 4:23 PM

## 2023-09-29 ENCOUNTER — Encounter: Payer: Self-pay | Admitting: Family Medicine

## 2023-09-29 LAB — CBC
HCT: 40.9 % (ref 36.0–46.0)
Hemoglobin: 13.9 g/dL (ref 12.0–15.0)
MCHC: 34.1 g/dL (ref 30.0–36.0)
MCV: 95.2 fl (ref 78.0–100.0)
Platelets: 249 10*3/uL (ref 150.0–400.0)
RBC: 4.3 Mil/uL (ref 3.87–5.11)
RDW: 13.2 % (ref 11.5–15.5)
WBC: 9.7 10*3/uL (ref 4.0–10.5)

## 2023-09-29 LAB — TSH: TSH: 1.04 u[IU]/mL (ref 0.35–5.50)

## 2023-09-29 LAB — BASIC METABOLIC PANEL WITH GFR
BUN: 11 mg/dL (ref 6–23)
CO2: 27 meq/L (ref 19–32)
Calcium: 9.6 mg/dL (ref 8.4–10.5)
Chloride: 103 meq/L (ref 96–112)
Creatinine, Ser: 0.78 mg/dL (ref 0.40–1.20)
GFR: 104.02 mL/min (ref >60.00)
Glucose, Bld: 89 mg/dL (ref 70–99)
Potassium: 4.2 meq/L (ref 3.5–5.1)
Sodium: 139 meq/L (ref 135–145)

## 2023-09-29 LAB — LUTEINIZING HORMONE: LH: 5.21 m[IU]/mL

## 2023-09-29 LAB — FOLLICLE STIMULATING HORMONE: FSH: 5.5 m[IU]/mL

## 2023-10-01 LAB — DRUG MONITORING PANEL 376104, URINE
Amphetamines: NEGATIVE ng/mL (ref <500)
Barbiturates: NEGATIVE ng/mL (ref <300)
Benzodiazepines: NEGATIVE ng/mL (ref <100)
Cocaine Metabolite: NEGATIVE ng/mL (ref <150)
Desmethyltramadol: NEGATIVE ng/mL (ref <100)
Opiates: NEGATIVE ng/mL (ref <100)
Oxycodone: NEGATIVE ng/mL (ref <100)
Tramadol: NEGATIVE ng/mL (ref <100)

## 2023-10-01 LAB — DM TEMPLATE

## 2023-10-03 ENCOUNTER — Encounter: Payer: Self-pay | Admitting: Family Medicine

## 2023-10-10 ENCOUNTER — Other Ambulatory Visit (HOSPITAL_BASED_OUTPATIENT_CLINIC_OR_DEPARTMENT_OTHER): Payer: Self-pay

## 2023-10-25 ENCOUNTER — Other Ambulatory Visit (HOSPITAL_BASED_OUTPATIENT_CLINIC_OR_DEPARTMENT_OTHER): Payer: Self-pay

## 2023-10-25 DIAGNOSIS — B078 Other viral warts: Secondary | ICD-10-CM | POA: Diagnosis not present

## 2023-10-25 DIAGNOSIS — L700 Acne vulgaris: Secondary | ICD-10-CM | POA: Diagnosis not present

## 2023-10-25 MED ORDER — CLINDAMYCIN PHOSPHATE 1 % EX SWAB
1.0000 "application " | Freq: Every day | CUTANEOUS | 5 refills | Status: AC
  Filled 2023-10-25: qty 60, 30d supply, fill #0
  Filled 2024-01-03: qty 60, 30d supply, fill #1
  Filled 2024-03-05 – 2024-03-15 (×2): qty 60, 30d supply, fill #2
  Filled 2024-07-10: qty 60, 30d supply, fill #3

## 2023-11-03 ENCOUNTER — Other Ambulatory Visit: Payer: Self-pay

## 2023-11-03 ENCOUNTER — Other Ambulatory Visit (HOSPITAL_BASED_OUTPATIENT_CLINIC_OR_DEPARTMENT_OTHER): Payer: Self-pay

## 2024-01-03 ENCOUNTER — Other Ambulatory Visit: Payer: Self-pay

## 2024-01-03 ENCOUNTER — Other Ambulatory Visit (HOSPITAL_BASED_OUTPATIENT_CLINIC_OR_DEPARTMENT_OTHER): Payer: Self-pay

## 2024-01-04 DIAGNOSIS — F4322 Adjustment disorder with anxiety: Secondary | ICD-10-CM | POA: Diagnosis not present

## 2024-01-10 DIAGNOSIS — F4322 Adjustment disorder with anxiety: Secondary | ICD-10-CM | POA: Diagnosis not present

## 2024-01-25 DIAGNOSIS — F4322 Adjustment disorder with anxiety: Secondary | ICD-10-CM | POA: Diagnosis not present

## 2024-02-16 DIAGNOSIS — F4322 Adjustment disorder with anxiety: Secondary | ICD-10-CM | POA: Diagnosis not present

## 2024-02-21 DIAGNOSIS — F4322 Adjustment disorder with anxiety: Secondary | ICD-10-CM | POA: Diagnosis not present

## 2024-02-29 DIAGNOSIS — F4322 Adjustment disorder with anxiety: Secondary | ICD-10-CM | POA: Diagnosis not present

## 2024-03-05 ENCOUNTER — Other Ambulatory Visit (HOSPITAL_BASED_OUTPATIENT_CLINIC_OR_DEPARTMENT_OTHER): Payer: Self-pay

## 2024-03-05 ENCOUNTER — Other Ambulatory Visit: Payer: Self-pay

## 2024-03-05 ENCOUNTER — Other Ambulatory Visit: Payer: Self-pay | Admitting: Family Medicine

## 2024-03-05 ENCOUNTER — Encounter: Payer: Self-pay | Admitting: Family Medicine

## 2024-03-05 MED ORDER — NITROFURANTOIN MONOHYD MACRO 100 MG PO CAPS: 100.0000 mg | ORAL_CAPSULE | Freq: Two times a day (BID) | ORAL | 0 refills | Status: AC

## 2024-03-08 ENCOUNTER — Other Ambulatory Visit (HOSPITAL_BASED_OUTPATIENT_CLINIC_OR_DEPARTMENT_OTHER): Payer: Self-pay

## 2024-03-08 MED ORDER — NITROFURANTOIN MONOHYD MACRO 100 MG PO CAPS
100.0000 mg | ORAL_CAPSULE | Freq: Every day | ORAL | 0 refills | Status: DC
  Filled 2024-03-08 – 2024-03-15 (×3): qty 30, 30d supply, fill #0
  Filled ????-??-??: fill #0

## 2024-03-12 DIAGNOSIS — F4322 Adjustment disorder with anxiety: Secondary | ICD-10-CM | POA: Diagnosis not present

## 2024-03-15 ENCOUNTER — Other Ambulatory Visit (HOSPITAL_COMMUNITY): Payer: Self-pay

## 2024-03-15 ENCOUNTER — Other Ambulatory Visit: Payer: Self-pay

## 2024-03-15 ENCOUNTER — Other Ambulatory Visit (HOSPITAL_BASED_OUTPATIENT_CLINIC_OR_DEPARTMENT_OTHER): Payer: Self-pay

## 2024-04-06 ENCOUNTER — Other Ambulatory Visit: Payer: Self-pay | Admitting: Medical Genetics

## 2024-04-06 DIAGNOSIS — Z006 Encounter for examination for normal comparison and control in clinical research program: Secondary | ICD-10-CM

## 2024-04-16 DIAGNOSIS — B078 Other viral warts: Secondary | ICD-10-CM | POA: Diagnosis not present

## 2024-05-01 ENCOUNTER — Other Ambulatory Visit: Payer: Self-pay | Admitting: Family Medicine

## 2024-05-01 ENCOUNTER — Other Ambulatory Visit (HOSPITAL_BASED_OUTPATIENT_CLINIC_OR_DEPARTMENT_OTHER): Payer: Self-pay

## 2024-05-01 ENCOUNTER — Telehealth: Payer: Self-pay

## 2024-05-01 DIAGNOSIS — F900 Attention-deficit hyperactivity disorder, predominantly inattentive type: Secondary | ICD-10-CM

## 2024-05-01 MED ORDER — NITROFURANTOIN MONOHYD MACRO 100 MG PO CAPS
100.0000 mg | ORAL_CAPSULE | Freq: Every day | ORAL | 0 refills | Status: AC | PRN
  Filled 2024-05-01: qty 30, 30d supply, fill #0

## 2024-05-01 NOTE — Telephone Encounter (Signed)
 Requesting: Adderall XR 15mg  and Adderall 15mg  Contract:09/28/23 UDS:09/28/23 Last Visit: 09/29/23 Next Visit: None Last Refill: see med list  Please Advise

## 2024-05-01 NOTE — Telephone Encounter (Signed)
 Due for CPE, plz sched. Ty.

## 2024-05-01 NOTE — Telephone Encounter (Signed)
 Message sent to pt about needing appt on Adderall

## 2024-05-15 LAB — GENECONNECT MOLECULAR SCREEN: Genetic Analysis Overall Interpretation: NEGATIVE

## 2024-05-21 ENCOUNTER — Encounter: Payer: Self-pay | Admitting: Family Medicine

## 2024-05-21 ENCOUNTER — Other Ambulatory Visit (HOSPITAL_BASED_OUTPATIENT_CLINIC_OR_DEPARTMENT_OTHER): Payer: Self-pay

## 2024-05-21 ENCOUNTER — Ambulatory Visit (INDEPENDENT_AMBULATORY_CARE_PROVIDER_SITE_OTHER): Admitting: Family Medicine

## 2024-05-21 VITALS — BP 120/72 | HR 82 | Temp 98.0°F | Resp 16 | Ht 63.0 in | Wt 138.2 lb

## 2024-05-21 DIAGNOSIS — F900 Attention-deficit hyperactivity disorder, predominantly inattentive type: Secondary | ICD-10-CM

## 2024-05-21 DIAGNOSIS — Z0000 Encounter for general adult medical examination without abnormal findings: Secondary | ICD-10-CM

## 2024-05-21 MED ORDER — AMPHETAMINE-DEXTROAMPHET ER 15 MG PO CP24: 15.0000 mg | ORAL_CAPSULE | ORAL | 0 refills | Status: AC

## 2024-05-21 MED ORDER — AMPHETAMINE-DEXTROAMPHET ER 15 MG PO CP24
15.0000 mg | ORAL_CAPSULE | ORAL | 0 refills | Status: AC
  Filled 2024-05-21: qty 30, 30d supply, fill #0

## 2024-05-21 MED ORDER — AMPHETAMINE-DEXTROAMPHETAMINE 15 MG PO TABS
15.0000 mg | ORAL_TABLET | Freq: Every day | ORAL | 0 refills | Status: DC | PRN
  Filled 2024-05-21: qty 30, 30d supply, fill #0

## 2024-05-21 MED ORDER — AMPHETAMINE-DEXTROAMPHET ER 15 MG PO CP24
15.0000 mg | ORAL_CAPSULE | ORAL | 0 refills | Status: AC
  Filled 2024-07-10: qty 30, 30d supply, fill #0

## 2024-05-21 NOTE — Patient Instructions (Addendum)
 Give us  2-3 business days to get the results of your labs back.   Keep the diet clean and stay active.  Please get me a copy of your advanced directive form at your convenience.   Consider magnesium 200-500 mg daily to help with headaches.   Heat (pad or rice pillow in microwave) over affected area, 10-15 minutes twice daily.   Let us  know if you need anything.  Trapezius stretches/exercises Do exercises exactly as told by your health care provider and adjust them as directed. It is normal to feel mild stretching, pulling, tightness, or discomfort as you do these exercises, but you should stop right away if you feel sudden pain or your pain gets worse.   Stretching and range of motion exercises These exercises warm up your muscles and joints and improve the movement and flexibility of your shoulder. These exercises can also help to relieve pain, numbness, and tingling. If you are unable to do any of the following for any reason, do not further attempt to do it.   Exercise A: Flexion, standing     Stand and hold a broomstick, a cane, or a similar object. Place your hands a little more than shoulder-width apart on the object. Your left / right hand should be palm-up, and your other hand should be palm-down. Push the stick to raise your left / right arm out to your side and then over your head. Use your other hand to help move the stick. Stop when you feel a stretch in your shoulder, or when you reach the angle that is recommended by your health care provider. Avoid shrugging your shoulder while you raise your arm. Keep your shoulder blade tucked down toward your spine. Hold for 30 seconds. Slowly return to the starting position. Repeat 2 times. Complete this exercise 3 times per week.  Exercise B: Abduction, supine     Lie on your back and hold a broomstick, a cane, or a similar object. Place your hands a little more than shoulder-width apart on the object. Your left / right hand should  be palm-up, and your other hand should be palm-down. Push the stick to raise your left / right arm out to your side and then over your head. Use your other hand to help move the stick. Stop when you feel a stretch in your shoulder, or when you reach the angle that is recommended by your health care provider. Avoid shrugging your shoulder while you raise your arm. Keep your shoulder blade tucked down toward your spine. Hold for 30 seconds. Slowly return to the starting position. Repeat 2 times. Complete this exercise 3 times per week.  Exercise C: Flexion, active-assisted     Lie on your back. You may bend your knees for comfort. Hold a broomstick, a cane, or a similar object. Place your hands about shoulder-width apart on the object. Your palms should face toward your feet. Raise the stick and move your arms over your head and behind your head, toward the floor. Use your healthy arm to help your left / right arm move farther. Stop when you feel a gentle stretch in your shoulder, or when you reach the angle where your health care provider tells you to stop. Hold for 30 seconds. Slowly return to the starting position. Repeat 2 times. Complete this exercise 3 times per week.  Exercise D: External rotation and abduction     Stand in a door frame with one of your feet slightly in front of  the other. This is called a staggered stance. Choose one of the following positions as told by your health care provider: Place your hands and forearms on the door frame above your head. Place your hands and forearms on the door frame at the height of your head. Place your hands on the door frame at the height of your elbows. Slowly move your weight onto your front foot until you feel a stretch across your chest and in the front of your shoulders. Keep your head and chest upright and keep your abdominal muscles tight. Hold for 30 seconds. To release the stretch, shift your weight to your back foot. Repeat 2  times. Complete this stretch 3 times per week.  Strengthening exercises These exercises build strength and endurance in your shoulder. Endurance is the ability to use your muscles for a long time, even after your muscles get tired. Exercise E: Scapular depression and adduction  Sit on a stable chair. Support your arms in front of you with pillows, armrests, or a tabletop. Keep your elbows in line with the sides of your body. Gently move your shoulder blades down toward your middle back. Relax the muscles on the tops of your shoulders and in the back of your neck. Hold for 3 seconds. Slowly release the tension and relax your muscles completely before doing this exercise again. Repeat for a total of 10 repetitions. After you have practiced this exercise, try doing the exercise without the arm support. Then, try the exercise while standing instead of sitting. Repeat 2 times. Complete this exercise 3 times per week.  Exercise F: Shoulder abduction, isometric     Stand or sit about 4-6 inches (10-15 cm) from a wall with your left / right side facing the wall. Bend your left / right elbow and gently press your elbow against the wall. Increase the pressure slowly until you are pressing as hard as you can without shrugging your shoulder. Hold for 3 seconds. Slowly release the tension and relax your muscles completely. Repeat for a total of 10 repetitions. Repeat 2 times. Complete this exercise 3 times per week.  Exercise G: Shoulder flexion, isometric     Stand or sit about 4-6 inches (10-15 cm) away from a wall with your left / right side facing the wall. Keep your left / right elbow straight and gently press the top of your fist against the wall. Increase the pressure slowly until you are pressing as hard as you can without shrugging your shoulder. Hold for 10-15 seconds. Slowly release the tension and relax your muscles completely. Repeat for a total of 10 repetitions. Repeat 2 times.  Complete this exercise 3 times per week.  Exercise H: Internal rotation     Sit in a stable chair without armrests, or stand. Secure an exercise band at your left / right side, at elbow height. Place a soft object, such as a folded towel or a small pillow, under your left / right upper arm so your elbow is a few inches (about 8 cm) away from your side. Hold the end of the exercise band so the band stretches. Keeping your elbow pressed against the soft object under your arm, move your forearm across your body toward your abdomen. Keep your body steady so the movement is only coming from your shoulder. Hold for 3 seconds. Slowly return to the starting position. Repeat for a total of 10 repetitions. Repeat 2 times. Complete this exercise 3 times per week.  Exercise I: External  rotation     Sit in a stable chair without armrests, or stand. Secure an exercise band at your left / right side, at elbow height. Place a soft object, such as a folded towel or a small pillow, under your left / right upper arm so your elbow is a few inches (about 8 cm) away from your side. Hold the end of the exercise band so the band stretches. Keeping your elbow pressed against the soft object under your arm, move your forearm out, away from your abdomen. Keep your body steady so the movement is only coming from your shoulder. Hold for 3 seconds. Slowly return to the starting position. Repeat for a total of 10 repetitions. Repeat 2 times. Complete this exercise 3 times per week. Exercise J: Shoulder extension  Sit in a stable chair without armrests, or stand. Secure an exercise band to a stable object in front of you so the band is at shoulder height. Hold one end of the exercise band in each hand. Your palms should face each other. Straighten your elbows and lift your hands up to shoulder height. Step back, away from the secured end of the exercise band, until the band stretches. Squeeze your shoulder blades  together and pull your hands down to the sides of your thighs. Stop when your hands are straight down by your sides. Do not let your hands go behind your body. Hold for 3 seconds. Slowly return to the starting position. Repeat for a total of 10 repetitions. Repeat 2 times. Complete this exercise 3 times per week.  Exercise K: Shoulder extension, prone     Lie on your abdomen on a firm surface so your left / right arm hangs over the edge. Hold a 5 lb weight in your hand so your palm faces in toward your body. Your arm should be straight. Squeeze your shoulder blade down toward the middle of your back. Slowly raise your arm behind you, up to the height of the surface that you are lying on. Keep your arm straight. Hold for 3 seconds. Slowly return to the starting position and relax your muscles. Repeat for a total of 10 repetitions. Repeat 2 times. Complete this exercise 3 times per week.   Exercise L: Horizontal abduction, prone  Lie on your abdomen on a firm surface so your left / right arm hangs over the edge. Hold a 5 lb weight in your hand so your palm faces toward your feet. Your arm should be straight. Squeeze your shoulder blade down toward the middle of your back. Bend your elbow so your hand moves up, until your elbow is bent to an L shape (90 degrees). With your elbow bent, slowly move your forearm forward and up. Raise your hand up to the height of the surface that you are lying on. Your upper arm should not move, and your elbow should stay bent. At the top of the movement, your palm should face the floor. Hold for 3 seconds. Slowly return to the starting position and relax your muscles. Repeat for a total of 10 repetitions. Repeat 2 times. Complete this exercise 3 times per week.  Exercise M: Horizontal abduction, standing  Sit on a stable chair, or stand. Secure an exercise band to a stable object in front of you so the band is at shoulder height. Hold one end of the  exercise band in each hand. Straighten your elbows and lift your hands straight in front of you, up to shoulder height. Your  palms should face down, toward the floor. Step back, away from the secured end of the exercise band, until the band stretches. Move your arms out to your sides, and keep your arms straight. Hold for 3 seconds. Slowly return to the starting position. Repeat for a total of 10 repetitions. Repeat 2 times. Complete this exercise 3 times per week.  Exercise N: Scapular retraction and elevation  Sit on a stable chair, or stand. Secure an exercise band to a stable object in front of you so the band is at shoulder height. Hold one end of the exercise band in each hand. Your palms should face each other. Sit in a stable chair without armrests, or stand. Step back, away from the secured end of the exercise band, until the band stretches. Squeeze your shoulder blades together and lift your hands over your head. Keep your elbows straight. Hold for 3 seconds. Slowly return to the starting position. Repeat for a total of 10 repetitions. Repeat 2 times. Complete this exercise 3 times per week.  This information is not intended to replace advice given to you by your health care provider. Make sure you discuss any questions you have with your health care provider. Document Released: 06/14/2005 Document Revised: 02/19/2016 Document Reviewed: 05/01/2015 Elsevier Interactive Patient Education  2017 [employer].

## 2024-05-21 NOTE — Progress Notes (Signed)
 Chief Complaint  Patient presents with   Annual Exam    CPE     Well Woman Gail Wilson is here for a complete physical.   Her last physical was >1 year ago.  Current diet: in general, diet could be better.  Current exercise: active at work and at home. Contraception? No Fatigue out of ordinary? No Seatbelt? Yes Advanced directive? No  Health Maintenance Pap/HPV- Has one in a couple weeks Tetanus- Yes HIV screening- Yes Hep C screening- Yes  Past Medical History:  Diagnosis Date   ADHD (attention deficit hyperactivity disorder)    Anxiety    Depression      Past Surgical History:  Procedure Laterality Date   NO PAST SURGERIES      Medications  Current Outpatient Medications on File Prior to Visit  Medication Sig Dispense Refill   amphetamine -dextroamphetamine  (ADDERALL XR) 15 MG 24 hr capsule Take 1 capsule by mouth every morning. 30 capsule 0   amphetamine -dextroamphetamine  (ADDERALL XR) 15 MG 24 hr capsule Take 1 capsule by mouth every morning. 30 capsule 0   amphetamine -dextroamphetamine  (ADDERALL XR) 15 MG 24 hr capsule Take 1 capsule by mouth every morning. 30 capsule 0   amphetamine -dextroamphetamine  (ADDERALL) 15 MG tablet Take 1 tablet by mouth daily in the afternoon as needed. 30 tablet 0   clindamycin  (CLEOCIN  T) 1 % SWAB Apply 1 application  topically daily. 60 each 5   nitrofurantoin , macrocrystal-monohydrate, (MACROBID ) 100 MG capsule Take 1 capsule (100 mg total) by mouth daily as needed. 30 capsule 0    Allergies Allergies  Allergen Reactions   Amoxil [Amoxicillin]    Penicillins     When she was young, possible a rash    Review of Systems: Constitutional:  no unexpected weight changes Eye:  no recent significant change in vision Ear/Nose/Mouth/Throat:  Ears:  no tinnitus or vertigo and no recent change in hearing Nose/Mouth/Throat:  no complaints of nasal congestion, no sore throat Cardiovascular: no chest pain Respiratory:  no cough and  no shortness of breath Gastrointestinal:  no abdominal pain, no change in bowel habits GU:  Female: negative for dysuria or pelvic pain Musculoskeletal/Extremities:  no pain of the joints Integumentary (Skin/Breast):  no abnormal skin lesions reported Neurologic:  no headaches Endocrine:  denies fatigue Hematologic/Lymphatic:  No areas of easy bleeding  Exam BP 120/72 (BP Location: Left Arm, Patient Position: Sitting)   Pulse 82   Temp 98 F (36.7 C) (Oral)   Resp 16   Ht 5' 3 (1.6 m)   Wt 138 lb 3.2 oz (62.7 kg)   SpO2 100%   BMI 24.48 kg/m  General:  well developed, well nourished, in no apparent distress Skin:  no significant moles, warts, or growths Head:  no masses, lesions, or tenderness Eyes:  pupils equal and round, sclera anicteric without injection Ears:  canals without lesions, TMs shiny without retraction, no obvious effusion, no erythema Nose:  nares patent, mucosa normal, and no drainage  Throat/Pharynx:  lips and gingiva without lesion; tongue and uvula midline; non-inflamed pharynx; no exudates or postnasal drainage Neck: neck supple without adenopathy, thyromegaly, or masses Lungs:  clear to auscultation, breath sounds equal bilaterally, no respiratory distress Cardio:  regular rate and rhythm, no bruits, no LE edema Abdomen:  abdomen soft, nontender; bowel sounds normal; no masses or organomegaly Genital: Defer to GYN Musculoskeletal:  symmetrical muscle groups noted without atrophy or deformity Extremities:  no clubbing, cyanosis, or edema, no deformities, no skin discoloration Neuro:  gait normal; deep tendon reflexes normal and symmetric Psych: well oriented with normal range of affect and appropriate judgment/insight  Assessment and Plan  Well adult exam - Plan: CBC, Comprehensive metabolic panel with GFR, Lipid panel  Attention deficit hyperactivity disorder (ADHD), predominantly inattentive type - Plan: amphetamine -dextroamphetamine  (ADDERALL XR) 15  MG 24 hr capsule, amphetamine -dextroamphetamine  (ADDERALL XR) 15 MG 24 hr capsule, amphetamine -dextroamphetamine  (ADDERALL) 15 MG tablet, amphetamine -dextroamphetamine  (ADDERALL XR) 15 MG 24 hr capsule   Well 28 y.o. female. Counseled on diet and exercise. Refill Adderall. She has had some headaches, tight shoulders.  Stretches and exercises for the trapezius musculature provided.  Magnesium 200-500 mg daily recommended.  Heat, ice, Tylenol . Other orders as above. Follow up in 6 mo. The patient voiced understanding and agreement to the plan.  Mabel [place], DO 05/21/24 3:55 PM

## 2024-05-22 ENCOUNTER — Ambulatory Visit: Payer: Self-pay | Admitting: Family Medicine

## 2024-05-22 LAB — LIPID PANEL
Cholesterol: 148 mg/dL (ref 0–200)
HDL: 59.4 mg/dL (ref >39.00)
LDL Cholesterol: 79 mg/dL (ref 0–99)
NonHDL: 89.02
Total CHOL/HDL Ratio: 2
Triglycerides: 48 mg/dL (ref 0.0–149.0)
VLDL: 9.6 mg/dL (ref 0.0–40.0)

## 2024-05-22 LAB — COMPREHENSIVE METABOLIC PANEL WITH GFR
ALT: 7 U/L (ref 0–35)
AST: 13 U/L (ref 0–37)
Albumin: 4.6 g/dL (ref 3.5–5.2)
Alkaline Phosphatase: 32 U/L — ABNORMAL LOW (ref 39–117)
BUN: 9 mg/dL (ref 6–23)
CO2: 29 meq/L (ref 19–32)
Calcium: 9.4 mg/dL (ref 8.4–10.5)
Chloride: 102 meq/L (ref 96–112)
Creatinine, Ser: 0.76 mg/dL (ref 0.40–1.20)
GFR: 106.82 mL/min (ref >60.00)
Glucose, Bld: 92 mg/dL (ref 70–99)
Potassium: 4.2 meq/L (ref 3.5–5.1)
Sodium: 138 meq/L (ref 135–145)
Total Bilirubin: 0.6 mg/dL (ref 0.2–1.2)
Total Protein: 7.2 g/dL (ref 6.0–8.3)

## 2024-05-22 LAB — CBC
HCT: 38.3 % (ref 36.0–46.0)
Hemoglobin: 13.2 g/dL (ref 12.0–15.0)
MCHC: 34.5 g/dL (ref 30.0–36.0)
MCV: 92.7 fl (ref 78.0–100.0)
Platelets: 265 K/uL (ref 150.0–400.0)
RBC: 4.13 Mil/uL (ref 3.87–5.11)
RDW: 12.8 % (ref 11.5–15.5)
WBC: 7.6 K/uL (ref 4.0–10.5)

## 2024-05-28 DIAGNOSIS — Z6824 Body mass index (BMI) 24.0-24.9, adult: Secondary | ICD-10-CM | POA: Diagnosis not present

## 2024-05-28 DIAGNOSIS — Z01419 Encounter for gynecological examination (general) (routine) without abnormal findings: Secondary | ICD-10-CM | POA: Diagnosis not present

## 2024-06-18 ENCOUNTER — Telehealth: Admitting: Physician Assistant

## 2024-06-18 DIAGNOSIS — J101 Influenza due to other identified influenza virus with other respiratory manifestations: Secondary | ICD-10-CM

## 2024-06-18 MED ORDER — BENZONATATE 100 MG PO CAPS
100.0000 mg | ORAL_CAPSULE | Freq: Three times a day (TID) | ORAL | 0 refills | Status: AC | PRN
  Filled 2024-06-18: qty 30, 5d supply, fill #0

## 2024-06-18 MED ORDER — OSELTAMIVIR PHOSPHATE 75 MG PO CAPS
75.0000 mg | ORAL_CAPSULE | Freq: Two times a day (BID) | ORAL | 0 refills | Status: AC
  Filled 2024-06-18: qty 10, 5d supply, fill #0

## 2024-06-18 NOTE — Progress Notes (Signed)
 E visit for Flu like symptoms   We are sorry that you are not feeling well.  Here is how we plan to help! Based on what you have shared with me it looks like you may have a respiratory virus that may be influenza.  Influenza or the flu is  an infection caused by a respiratory virus. The flu virus is highly contagious and persons who did not receive their yearly flu vaccination may catch the flu from close contact.  We have anti-viral medications to treat the viruses that cause this infection. They are not a cure and only shorten the course of the infection. These prescriptions are most effective when they are given within the first 2 days of flu symptoms. Antiviral medications are indicated if you have a high risk of complications from the flu. You should  also consider an antiviral medication if you are in close contact with someone who is at risk. These medications can help patients avoid complications from the flu but have side effects that you should know.   Possible side effects from Tamiflu  or oseltamivir  include nausea, vomiting, diarrhea, dizziness, headaches, eye redness, sleep problems or other respiratory symptoms. You should not take Tamiflu  if you have an allergy to oseltamivir  or any to the ingredients in Tamiflu .  Based upon your symptoms and potential risk factors I have prescribed Oseltamivir  (Tamiflu ).  It has been sent to your designated pharmacy.  You will take one 75 mg capsule orally twice a day for the next 5 days.   For nasal congestion, you may use an oral decongestant such as Mucinex D or if you have glaucoma or high blood pressure use plain Mucinex.  Saline nasal spray or nasal drops can help and can safely be used as often as needed for congestion.  If you have a sore or scratchy throat, use a saltwater gargle-  to  teaspoon of salt dissolved in a 4-ounce to 8-ounce glass of warm water .  Gargle the solution for approximately 15-30 seconds and then spit.  It is  important not to swallow the solution.  You can also use throat lozenges/cough drops and Chloraseptic spray to help with throat pain or discomfort.  Warm or cold liquids can also be helpful in relieving throat pain.  For headache, pain or general discomfort, you can use Ibuprofen  or Tylenol  as directed.   Some authorities believe that zinc sprays or the use of Echinacea may shorten the course of your symptoms.  I have prescribed the following medications to help lessen symptoms: I have prescribed Tessalon  Perles 100 mg. You may take 1-2 capsules every 8 hours as needed for cough  You are to isolate at home until you have been fever-free for at least 24 hours without a fever-reducing medication, and symptoms have been steadily improving for 24 hours.  If you must be around other household members who do not have symptoms, you need to make sure that both you and the family members are masking consistently with a high-quality mask.  If you note any worsening of symptoms despite treatment, please seek an in-person evaluation ASAP. If you note any significant shortness of breath or any chest pain, please seek ED evaluation. Please do not delay care!  ANYONE WHO HAS FLU SYMPTOMS SHOULD: Stay home. The flu is highly contagious and going out or to work exposes others! Be sure to drink plenty of fluids. Water  is fine as well as fruit juices, sodas and electrolyte beverages. You may want to stay  away from caffeine  or alcohol. If you are nauseated, try taking small sips of liquids. How do you know if you are getting enough fluid? Your urine should be a pale yellow or almost colorless. Get rest. Taking a steamy shower or using a humidifier may help nasal congestion and ease sore throat pain. Using a saline nasal spray works much the same way. Cough drops, hard candies and sore throat lozenges may ease your cough. Line up a caregiver. Have someone check on you regularly.  GET HELP RIGHT AWAY IF: You cannot  keep down liquids or your medications. You become short of breath Your fell like you are going to pass out or loose consciousness. Your symptoms persist after you have completed your treatment plan  MAKE SURE YOU  Understand these instructions. Will watch your condition. Will get help right away if you are not doing well or get worse.  Your e-visit answers were reviewed by a board certified advanced clinical practitioner to complete your personal care plan.  Depending on the condition, your plan could have included both over the counter or prescription medications.  If there is a problem please reply  once you have received a response from your provider.  Your safety is important to us .  If you have drug allergies check your prescription carefully.    You can use MyChart to ask questions about todays visit, request a non-urgent call back, or ask for a work or school excuse for 24 hours related to this e-Visit. If it has been greater than 24 hours you will need to follow up with your provider, or enter a new e-Visit to address those concerns.  You will get an e-mail in the next two days asking about your experience.  I hope that your e-visit has been valuable and will speed your recovery. Thank you for using e-visits.   I have spent 5 minutes in review of e-visit questionnaire, review and updating patient chart, medical decision making and response to patient.   Delon CHRISTELLA Dickinson, PA-C

## 2024-06-19 ENCOUNTER — Other Ambulatory Visit (HOSPITAL_BASED_OUTPATIENT_CLINIC_OR_DEPARTMENT_OTHER): Payer: Self-pay

## 2024-06-19 ENCOUNTER — Other Ambulatory Visit: Payer: Self-pay

## 2024-07-10 ENCOUNTER — Other Ambulatory Visit: Payer: Self-pay | Admitting: Family Medicine

## 2024-07-10 ENCOUNTER — Other Ambulatory Visit (HOSPITAL_BASED_OUTPATIENT_CLINIC_OR_DEPARTMENT_OTHER): Payer: Self-pay

## 2024-07-10 ENCOUNTER — Other Ambulatory Visit: Payer: Self-pay

## 2024-07-10 DIAGNOSIS — F900 Attention-deficit hyperactivity disorder, predominantly inattentive type: Secondary | ICD-10-CM

## 2024-07-10 MED ORDER — AMPHETAMINE-DEXTROAMPHETAMINE 15 MG PO TABS
15.0000 mg | ORAL_TABLET | Freq: Every day | ORAL | 0 refills | Status: AC | PRN
  Filled 2024-07-10: qty 30, 30d supply, fill #0

## 2024-08-15 ENCOUNTER — Ambulatory Visit: Admitting: Gastroenterology
